# Patient Record
Sex: Female | Born: 1985 | Race: White | Hispanic: No | Smoking: Former smoker
Health system: Southern US, Community
[De-identification: ages and names within clinical notes are randomized; demographics above are authoritative.]

## PROBLEM LIST (undated history)

## (undated) DIAGNOSIS — N644 Mastodynia: Secondary | ICD-10-CM

## (undated) DIAGNOSIS — E039 Hypothyroidism, unspecified: Secondary | ICD-10-CM

## (undated) DIAGNOSIS — D649 Anemia, unspecified: Secondary | ICD-10-CM

## (undated) DIAGNOSIS — Z720 Tobacco use: Secondary | ICD-10-CM

## (undated) DIAGNOSIS — F32A Depression, unspecified: Secondary | ICD-10-CM

## (undated) HISTORY — DX: Anemia, unspecified: D64.9

## (undated) HISTORY — DX: Tobacco use: Z72.0

## (undated) HISTORY — DX: Depression, unspecified: F32.A

## (undated) HISTORY — DX: Hypothyroidism, unspecified: E03.9

## (undated) HISTORY — PX: OTHER SURGICAL HISTORY: SHX169

## (undated) HISTORY — DX: Mastodynia: N64.4

---
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
## 2007-08-21 ENCOUNTER — Emergency Department: Payer: Self-pay | Admitting: Emergency Medicine

## 2009-02-10 HISTORY — PX: INTRAUTERINE DEVICE (IUD) INSERTION: SHX5877

## 2009-05-09 ENCOUNTER — Observation Stay: Payer: Self-pay

## 2009-06-24 ENCOUNTER — Observation Stay: Payer: Self-pay

## 2009-07-09 ENCOUNTER — Observation Stay: Payer: Self-pay | Admitting: Obstetrics and Gynecology

## 2009-08-05 ENCOUNTER — Observation Stay: Payer: Self-pay

## 2009-08-29 ENCOUNTER — Observation Stay: Payer: Self-pay | Admitting: Obstetrics and Gynecology

## 2009-09-07 ENCOUNTER — Inpatient Hospital Stay: Payer: Self-pay

## 2010-09-24 ENCOUNTER — Emergency Department: Payer: Self-pay | Admitting: Emergency Medicine

## 2010-11-11 HISTORY — PX: IUD REMOVAL: SHX5392

## 2014-12-01 ENCOUNTER — Emergency Department
Admission: EM | Admit: 2014-12-01 | Discharge: 2014-12-02 | Disposition: A | Discharge status: Home / Self Care | Payer: BLUE CROSS/BLUE SHIELD | Attending: Emergency Medicine | Admitting: Emergency Medicine

## 2014-12-01 ENCOUNTER — Encounter: Payer: Self-pay | Admitting: Emergency Medicine

## 2014-12-01 DIAGNOSIS — M549 Dorsalgia, unspecified: Secondary | ICD-10-CM | POA: Diagnosis not present

## 2014-12-01 DIAGNOSIS — R1030 Lower abdominal pain, unspecified: Secondary | ICD-10-CM | POA: Diagnosis present

## 2014-12-01 DIAGNOSIS — Z720 Tobacco use: Secondary | ICD-10-CM | POA: Diagnosis not present

## 2014-12-01 DIAGNOSIS — Z3202 Encounter for pregnancy test, result negative: Secondary | ICD-10-CM | POA: Insufficient documentation

## 2014-12-01 LAB — COMPREHENSIVE METABOLIC PANEL
ALT: 8 U/L — ABNORMAL LOW (ref 14–54)
AST: 14 U/L — ABNORMAL LOW (ref 15–41)
Albumin: 3.8 g/dL (ref 3.5–5.0)
Alkaline Phosphatase: 27 U/L — ABNORMAL LOW (ref 38–126)
Anion gap: 3 — ABNORMAL LOW (ref 5–15)
BUN: 7 mg/dL (ref 6–20)
CHLORIDE: 109 mmol/L (ref 101–111)
CO2: 25 mmol/L (ref 22–32)
Calcium: 9.1 mg/dL (ref 8.9–10.3)
Creatinine, Ser: 0.64 mg/dL (ref 0.44–1.00)
GFR calc Af Amer: >60 mL/min (ref >60)
GFR calc non Af Amer: >60 mL/min (ref >60)
GLUCOSE: 91 mg/dL (ref 65–99)
POTASSIUM: 3.9 mmol/L (ref 3.5–5.1)
Sodium: 137 mmol/L (ref 135–145)
Total Bilirubin: 0.3 mg/dL (ref 0.3–1.2)
Total Protein: 6.7 g/dL (ref 6.5–8.1)

## 2014-12-01 LAB — URINALYSIS COMPLETE WITH MICROSCOPIC (ARMC ONLY)
Bilirubin Urine: NEGATIVE
Glucose, UA: NEGATIVE mg/dL
Hgb urine dipstick: NEGATIVE
Ketones, ur: NEGATIVE mg/dL
Leukocytes, UA: NEGATIVE
Nitrite: NEGATIVE
PH: 5 (ref 5.0–8.0)
PROTEIN: NEGATIVE mg/dL
Specific Gravity, Urine: 1.01 (ref 1.005–1.030)

## 2014-12-01 LAB — POCT PREGNANCY, URINE: Preg Test, Ur: NEGATIVE

## 2014-12-01 NOTE — ED Notes (Signed)
Pt. States bilateral lower abdominal pain and back pain that started Wednesday morning. Pt. Denies vomiting. Pt. States "I went to [facility] Wednesday, pt. States pain medication with rx has not decreased pain. Pt. States hx of ov ian cysts. Pt. Denies vaginal discharge.  Pt. Denies difficulty eating or drinking today.  Pt denies dysuria.

## 2014-12-01 NOTE — ED Notes (Signed)
Pt. States bilateral lower abdominal pain and back pain that started Wednesday morning.  Pt. Denies vomiting.  Pt. States "I went to [place] [facility] Wednesday, pt. States pain medication with rx has not decreased pain.  Pt. States hx of ov ian cysts.  Pt. Denies vaginal discharge.

## 2014-12-02 NOTE — ED Notes (Signed)
Pt. Going home with significant other.  Pt. States she will try to find PCP with suggestions given.

## 2014-12-02 NOTE — Discharge Instructions (Signed)
You have been seen in the Emergency Department (ED) for abdominal pain.  Your evaluation did not identify a clear cause of your symptoms but was generally reassuring.  As we discussed, we suspect her symptoms are due to ovarian cysts which are likely worse some months than they are others.  We recommend that you take over-the-counter pain medication as needed, read through the included information for more recommendations, and follow up with a primary care doctor and/or OB/GYN at the next available opportunity.  Please follow up as instructed above regarding todays emergent visit and the symptoms that are bothering you.  Return to the ED if your abdominal pain worsens or fails to improve, you develop bloody vomiting, bloody diarrhea, you are unable to tolerate fluids due to vomiting, fever greater than 101, or other symptoms that concern you.   Abdominal Pain, Adult Many things can cause abdominal pain. Usually, abdominal pain is not caused by a disease and will improve without treatment. It can often be observed and treated at home. Your health care provider will do a physical exam and possibly order blood tests and X-rays to help determine the seriousness of your pain. However, in many cases, more time must pass before a clear cause of the pain can be found. Before that point, your health care provider may not know if you need more testing or further treatment. HOME CARE INSTRUCTIONS Monitor your abdominal pain for any changes. The following actions may help to alleviate any discomfort you are experiencing:  Only take over-the-counter or prescription medicines as directed by your health care provider.  Do not take laxatives unless directed to do so by your health care provider.  Try a clear liquid diet (broth, tea, or water) as directed by your health care provider. Slowly move to a bland diet as tolerated. SEEK MEDICAL CARE IF:  You have unexplained abdominal pain.  You have abdominal pain  associated with nausea or diarrhea.  You have pain when you urinate or have a bowel movement.  You experience abdominal pain that wakes you in the night.  You have abdominal pain that is worsened or improved by eating food.  You have abdominal pain that is worsened with eating fatty foods.  You have a fever. SEEK IMMEDIATE MEDICAL CARE IF:  Your pain does not go away within 2 hours.  You keep throwing up (vomiting).  Your pain is felt only in portions of the abdomen, such as the right side or the left lower portion of the abdomen.  You pass bloody or black tarry stools. MAKE SURE YOU:  Understand these instructions.  Will watch your condition.  Will get help right away if you are not doing well or get worse.   This information is not intended to replace advice given to you by your health care provider. Make sure you discuss any questions you have with your health care provider.   Document Released: 11/06/2004 Document Revised: 10/18/2014 Document Reviewed: 10/06/2012 Elsevier Interactive Patient Education [employer]2016 Elsevier Inc.

## 2014-12-02 NOTE — ED Provider Notes (Signed)
[facility] Emergency Department Provider Note  ____________________________________________  Time seen: Approximately 12:36 AM  I have reviewed the triage vital signs and the nursing notes.   HISTORY  Chief Complaint Abdominal Pain    HPI Karen Carey is a 29 y.o. female with no significant past medical history except for intermittent issues with pain thought to be associated with ovarian cysts.  She presents with persistent bilateral lower abdominal pain and back pain which she says started 4 days ago.  She has had no nausea or vomiting.  She says that she went to the [place] emergency department about 2 days ago and "they did not do anything or find anything".  She was prescribed Norco 10 pills that states that these did not help her pain.  The pain has been gradual in onset, intermittent, severe, worsened with movement, and helped by nothing.  She denies dysuria, vaginal discharge, vaginal pain.  Her last menstrual period was about 3 weeks ago.  She has had this problem in the past, most recently in August, and said that she feels similar to the pain at that time.  She was intended to follow up with an OB/GYN and her primary care doctor, but she has not established care with either type of physician since her issues with ovarian pain in August.   History reviewed. No pertinent past medical history.  There are no active problems to display for this patient.   History reviewed. No pertinent past surgical history.  No current outpatient prescriptions on file.  Allergies Review of patient's allergies indicates no known allergies.  No family history on file.  Social History Social History  Substance Use Topics  . Smoking status: Current Every Day Smoker  . Smokeless tobacco: None  . Alcohol Use: Yes    Review of Systems Constitutional: No fever/chills Eyes: No visual changes. ENT: No sore throat. Cardiovascular: Denies chest  pain. Respiratory: Denies shortness of breath. Gastrointestinal: Lower abdominal pain.  No nausea, no vomiting.  No diarrhea.  No constipation. Genitourinary: Negative for dysuria. Musculoskeletal: Negative for back pain. Skin: Negative for rash. Neurological: Negative for headaches, focal weakness or numbness.  10-point ROS otherwise negative.  ____________________________________________   PHYSICAL EXAM:  VITAL SIGNS: ED Triage Vitals  Enc Vitals Group     BP 12/01/14 2104 123/77 mmHg     Pulse Rate 12/01/14 2104 83     Resp 12/01/14 2104 18     Temp 12/01/14 2104 97.8 F (36.6 C)     Temp src --      SpO2 12/01/14 2104 100 %     Weight 12/01/14 2104 120 lb (54.432 kg)     Height 12/01/14 2104  (1.575 m)     Head Cir --      Peak Flow --      Pain Score 12/01/14 2116 9     Pain Loc --      Pain Edu? --      Excl. in GC? --     Constitutional: Alert and oriented. Well appearing and in no acute distress. VSS, afebrile. Eyes: Conjunctivae are normal. PERRL. EOMI. Head: Atraumatic. Nose: No congestion/rhinnorhea. Mouth/Throat: Mucous membranes are moist.  Oropharynx non-erythematous. Neck: No stridor.   Cardiovascular: Normal rate, regular rhythm. Grossly normal heart sounds.  Good peripheral circulation. Respiratory: Normal respiratory effort.  No retractions. Lungs CTAB. Gastrointestinal: Soft with voluntary guarding to palpation of lower abdomen.  With distraction, she is minimally tender. No distention. No  CVA tenderness. Genitourinary: Deferred Musculoskeletal: No lower extremity tenderness nor edema.  No joint effusions. Neurologic:  Normal speech and language. No gross focal neurologic deficits are appreciated.  Skin:  Skin is warm, dry and intact. No rash noted. Psychiatric: Mood and affect are normal. Speech and behavior are normal.  ____________________________________________   LABS (all labs ordered are listed, but only abnormal results are  displayed)  Labs Reviewed  COMPREHENSIVE METABOLIC PANEL - Abnormal; Notable for the following:    AST 14 (*)    ALT 8 (*)    Alkaline Phosphatase 27 (*)    Anion gap 3 (*)    All other components within normal limits  URINALYSIS COMPLETEWITH MICROSCOPIC ([facility] ONLY) - Abnormal; Notable for the following:    Color, Urine YELLOW (*)    APPearance HAZY (*)    Bacteria, UA RARE (*)    Squamous Epithelial / LPF 6-30 (*)    All other components within normal limits  POCT PREGNANCY, URINE   ____________________________________________  EKG  Not indicated ____________________________________________  RADIOLOGY   No results found.  ____________________________________________   PROCEDURES  Procedure(s) performed: None  Critical Care performed: No ____________________________________________   INITIAL IMPRESSION / ASSESSMENT AND PLAN / ED COURSE  Pertinent labs & imaging results that were available during my care of the patient were reviewed by me and considered in my medical decision making (see chart for details).  The patient is well-appearing with normal vital signs.  I reviewed the entire emergency Department provider note and care everywhere from [place] visit [facility] and she received a full and thorough workup including a pelvic exam, GC/chlamydia swabs, wet prep, and lab work.  They did not obtain any imaging at that time because they did not feel it was indicated, and I agree.  I offered that we could obtain a transvaginal ultrasound at this time if she wants, but explained that I suspect her pain is due to ovarian cysts again, she declined having the imaging.  I explained that I cannot continue to write her for narcotic pain medicine and that NSAIDs are the best treatment for her.  I explained that she needs to follow up with primary care and I gave her 2 options for local doctors in [place] with she can follow-up if she does not want to follow-up in [place].   She understands and agrees with the plan.  ____________________________________________  FINAL CLINICAL IMPRESSION(S) / ED DIAGNOSES  Final diagnoses:  Lower abdominal pain      NEW MEDICATIONS STARTED DURING THIS VISIT:  New Prescriptions   No medications on file     Loleta Roseory Kyrsten Deleeuw, MD 12/02/14 [phone]

## 2017-06-11 ENCOUNTER — Encounter: Payer: Self-pay | Admitting: Certified Nurse Midwife

## 2017-06-12 ENCOUNTER — Encounter: Payer: Self-pay | Admitting: Certified Nurse Midwife

## 2017-06-12 ENCOUNTER — Ambulatory Visit (INDEPENDENT_AMBULATORY_CARE_PROVIDER_SITE_OTHER): Payer: BLUE CROSS/BLUE SHIELD | Admitting: Certified Nurse Midwife

## 2017-06-12 VITALS — BP 126/62 | HR 101 | Ht 62.0 in | Wt 157.0 lb

## 2017-06-12 DIAGNOSIS — Z124 Encounter for screening for malignant neoplasm of cervix: Secondary | ICD-10-CM | POA: Diagnosis not present

## 2017-06-12 DIAGNOSIS — Z01419 Encounter for gynecological examination (general) (routine) without abnormal findings: Secondary | ICD-10-CM

## 2017-06-12 DIAGNOSIS — Z13220 Encounter for screening for lipoid disorders: Secondary | ICD-10-CM | POA: Diagnosis not present

## 2017-06-12 DIAGNOSIS — Z131 Encounter for screening for diabetes mellitus: Secondary | ICD-10-CM

## 2017-06-12 DIAGNOSIS — F17210 Nicotine dependence, cigarettes, uncomplicated: Secondary | ICD-10-CM | POA: Diagnosis not present

## 2017-06-12 NOTE — Patient Instructions (Signed)
Vit E 400 IU twice a day  Steps to Quit Smoking Smoking tobacco can be harmful to your health and can affect almost every organ in your body. Smoking puts you, and those around you, at risk for developing many serious chronic diseases. Quitting smoking is difficult, but it is one of the best things that you can do for your health. It is never too late to quit. What are the benefits of quitting smoking? When you quit smoking, you lower your risk of developing serious diseases and conditions, such as:  Lung cancer or lung disease, such as COPD.  Heart disease.  Stroke.  Heart attack.  Infertility.  Osteoporosis and bone fractures.  Additionally, symptoms such as coughing, wheezing, and shortness of breath may get better when you quit. You may also find that you get sick less often because your body is stronger at fighting off colds and infections. If you are pregnant, quitting smoking can help to reduce your chances of having a baby of low birth weight. How do I get ready to quit? When you decide to quit smoking, create a plan to make sure that you are successful. Before you quit:  Pick a date to quit. Set a date within the next two weeks to give you time to prepare.  Write down the reasons why you are quitting. Keep this list in places where you will see it often, such as on your bathroom mirror or in your car or wallet.  Identify the people, places, things, and activities that make you want to smoke (triggers) and avoid them. Make sure to take these actions: ? Throw away all cigarettes at home, at work, and in your car. ? Throw away smoking accessories, such as Set designer. ? Clean your car and make sure to empty the ashtray. ? Clean your home, including curtains and carpets.  Tell your family, friends, and coworkers that you are quitting. Support from your loved ones can make quitting easier.  Talk with your health care provider about your options for quitting  smoking.  Find out what treatment options are covered by your health insurance.  What strategies can I use to quit smoking? Talk with your healthcare provider about different strategies to quit smoking. Some strategies include:  Quitting smoking altogether instead of gradually lessening how much you smoke over a period of time. Research shows that quitting "cold [place]" is more successful than gradually quitting.  Attending in-person counseling to help you build problem-solving skills. You are more likely to have success in quitting if you attend several counseling sessions. Even short sessions of 10 minutes can be effective.  Finding resources and support systems that can help you to quit smoking and remain smoke-free after you quit. These resources are most helpful when you use them often. They can include: ? Online chats with a Veterinary surgeon. ? Telephone quitlines. ? Automotive engineer. ? Support groups or group counseling. ? Text messaging programs. ? Mobile phone applications.  Taking medicines to help you quit smoking. (If you are pregnant or breastfeeding, talk with your health care provider first.) Some medicines contain nicotine and some do not. Both types of medicines help with cravings, but the medicines that include nicotine help to relieve withdrawal symptoms. Your health care provider may recommend: ? Nicotine patches, gum, or lozenges. ? Nicotine inhalers or sprays. ? Non-nicotine medicine that is taken by mouth.  Talk with your health care provider about combining strategies, such as taking medicines while you are also  receiving in-person counseling. Using these two strategies together makes you more likely to succeed in quitting than if you used either strategy on its own. If you are pregnant or breastfeeding, talk with your health care provider about finding counseling or other support strategies to quit smoking. Do not take medicine to help you quit smoking unless  told to do so by your health care provider. What things can I do to make it easier to quit? Quitting smoking might feel overwhelming at first, but there is a lot that you can do to make it easier. Take these important actions:  Reach out to your family and friends and ask that they support and encourage you during this time. Call telephone quitlines, reach out to support groups, or work with a counselor for support.  Ask people who smoke to avoid smoking around you.  Avoid places that trigger you to smoke, such as bars, parties, or smoke-break areas at work.  Spend time around people who do not smoke.  Lessen stress in your life, because stress can be a smoking trigger for some people. To lessen stress, try: ? Exercising regularly. ? Deep-breathing exercises. ? Yoga. ? Meditating. ? Performing a body scan. This involves closing your eyes, scanning your body from head to toe, and noticing which parts of your body are particularly tense. Purposefully relax the muscles in those areas.  Download or purchase mobile phone or tablet apps (applications) that can help you stick to your quit plan by providing reminders, tips, and encouragement. There are many free apps, such as QuitGuide from the [employer] developer for Disease Control and Prevention). You can find other support for quitting smoking (smoking cessation) through smokefree.gov and other websites.  How will I feel when I quit smoking? Within the first 24 hours of quitting smoking, you may start to feel some withdrawal symptoms. These symptoms are usually most noticeable 2-3 days after quitting, but they usually do not last beyond 2-3 weeks. Changes or symptoms that you might experience include:  Mood swings.  Restlessness, anxiety, or irritation.  Difficulty concentrating.  Dizziness.  Strong cravings for sugary foods in addition to nicotine.  Mild weight gain.  Constipation.  Nausea.  Coughing or a sore throat.  Changes in how  your medicines work in your body.  A depressed mood.  Difficulty sleeping (insomnia).  After the first 2-3 weeks of quitting, you may start to notice more positive results, such as:  Improved sense of smell and taste.  Decreased coughing and sore throat.  Slower heart rate.  Lower blood pressure.  Clearer skin.  The ability to breathe more easily.  Fewer sick days.  Quitting smoking is very challenging for most people. Do not get discouraged if you are not successful the first time. Some people need to make many attempts to quit before they achieve long-term success. Do your best to stick to your quit plan, and talk with your health care provider if you have any questions or concerns. This information is not intended to replace advice given to you by your health care provider. Make sure you discuss any questions you have with your health care provider. Document Released: 01/21/2001 Document Revised: 09/25/2015 Document Reviewed: 06/13/2014 Elsevier Interactive Patient Education  [employer].

## 2017-06-12 NOTE — Progress Notes (Signed)
Gynecology Annual Exam  PCP: Patient, No Pcp Per  Chief Complaint:  Chief Complaint  Patient presents with  . Gynecologic Exam    Fibrocystic breast    History of Present Illness: Karen Carey is a 33 y.o. White female, G1P1001, who presents for a NP gyn exam. She has not been seen at [facility] The patient complains of breast tenderness all but 1 week of the month since she was 70 or 32 years old. She has tried stopping caffeine, but reports she still had breast tenderness when she stopped drinking 1-2 20oz bottles of Dr Reino Kent and 2 glasses of sweet tea/day, so she resumed drinking caffeine. She has also taken Tylenol and ibuprofen and applied ice and heat with no relief. Her breasts feel lumpy when she does SBEs but she has never had a dominant cyst or mass.  Her menses are regular, they occur every month, and they last 7days with 4-5 heavier days requiring a tampon change every 2 hours. She does not have intermenstrual bleeding. Her last menstrual period was 05/28/2017. She has mild dysmenorrhea and bloating and takes Midol or uses a heating pad with relief Last pap smear: 3 years ago, results were normal per patient   The patient is sexually active. She currently uses Nexplanon (replaced 09/26/2014) for contraception. She has her current partner x 6 months. She declines STD testing   Her past medical history is remarkable for a TSVD in 2011.  The patient does perform self breast exams. Her last mammogram was NA.  There is no family history of breast cancer.   There is no family history of ovarian cancer.   The patient reports smoking. She smokes 0.5 packs per day. Started smoking at age 6, but stopped smoking during her pregnancy with the help of the nicotine patch, but then resumed smoking.  She reports drinking alcohol. She reports have 0-3 shots per night.   She denies illegal drug use.  The patient does not exercise.  The patient denies current symptoms of  depression.    Review of Systems: Review of Systems  Constitutional: Negative for chills, fever and weight loss.  HENT: Negative for congestion, sinus pain and sore throat.   Eyes: Negative for blurred vision and pain.  Respiratory: Negative for hemoptysis, shortness of breath and wheezing.   Cardiovascular: Negative for chest pain, palpitations and leg swelling.  Gastrointestinal: Negative for abdominal pain, blood in stool, diarrhea, heartburn, nausea and vomiting.  Genitourinary: Negative for dysuria, frequency, hematuria and urgency.       Positive for subjectively heavy menses  Musculoskeletal: Negative for back pain, joint pain and myalgias.  Skin: Negative for itching and rash.  Neurological: Negative for dizziness, tingling and headaches.  Endo/Heme/Allergies: Negative for environmental allergies and polydipsia. Does not bruise/bleed easily.       Negative for hirsutism   Psychiatric/Behavioral: Negative for depression. The patient is not nervous/anxious and does not have insomnia.   Breast: tenderness bilaterally, lumpy. Negative for nipple discharge  Past Medical History:  No past medical history on file.  Past Surgical History:  Past Surgical History:  Procedure Laterality Date  . INTRAUTERINE DEVICE (IUD) INSERTION  2011  . IUD REMOVAL  11/2010  . Nexplanon  11/25/2010   [facility]    Family History:  Family History  Problem Relation Age of Onset  . Diabetes Mother        Type 2  . Hypertension Mother   . Hypothyroidism Mother  Social History:  Social History   Socioeconomic History  . Marital status: Single    Spouse name: Not on file  . Number of children: Not on file  . Years of education: Not on file  . Highest education level: Not on file  Occupational History  . Not on file  Social Needs  . Financial resource strain: Not on file  . Food insecurity:    Worry: Not on file    Inability: Not on file  . Transportation needs:    Medical: Not on  file    Non-medical: Not on file  Tobacco Use  . Smoking status: Current Every Day Smoker  . Smokeless tobacco: Never Used  Substance and Sexual Activity  . Alcohol use: Yes  . Drug use: No  . Sexual activity: Yes    Partners: Male    Birth control/protection: Implant  Lifestyle  . Physical activity:    Days per week: 0 days    Minutes per session: 0 min  . Stress: Not on file  Relationships  . Social connections:    Talks on phone: Not on file    Gets together: Not on file    Attends religious service: Not on file    Active member of club or organization: Not on file    Attends meetings of clubs or organizations: Not on file    Relationship status: Not on file  . Intimate partner violence:    Fear of current or ex partner: Not on file    Emotionally abused: Not on file    Physically abused: Not on file    Forced sexual activity: Not on file  Other Topics Concern  . Not on file  Social History Narrative  . Not on file    Allergies:  No Known Allergies  Medications: Prior to Admission medications   Medication Sig Start Date End Date Taking? Authorizing Provider  dicyclomine (BENTYL) 20 MG tablet Take by mouth. 06/06/17 06/16/17 Yes [provider]  etonogestrel (NEXPLANON) 68 MG IMPL implant 1 each by Subdermal route once.   Yes [provider]  ibuprofen (ADVIL,MOTRIN) 800 MG tablet Take by mouth. 06/06/17  Yes [provider]    Physical Exam Vitals: BP 126/62 (BP Location: Left Arm, Patient Position: Sitting, Cuff Size: Normal)   Pulse (!) 101   Ht  (1.575 m)   Wt 157 lb (71.2 kg)   LMP 05/28/2017   BMI 28.72 kg/m General:WF in  NAD HEENT: normocephalic, anicteric Neck: no thyroid enlargement, no palpable nodules, no cervical lymphadenopathy  Pulmonary: No increased work of breathing, CTAB Cardiovascular: RRR, without murmur  Breast: Breast symmetrical, tenderness bilaterally , no palpable nodules or masses, no skin or nipple  retraction present, no nipple discharge.  No axillary, infraclavicular or supraclavicular lymphadenopathy. Abdomen: Soft, non-tender, non-distended.  Umbilicus without lesions.  No hepatomegaly or masses palpable. No evidence of hernia. Genitourinary:  External: Normal external female genitalia.  Normal urethral meatus, normal Bartholin's and  Skene's glands.    Vagina: Normal vaginal mucosa, no evidence of prolapse.    Cervix: Grossly normal in appearance, no bleeding, non-tender  Uterus: Anteverted, normal size, shape, and consistency, mobile, and non-tender  Adnexa: No adnexal masses, non-tender  Rectal: deferred  Lymphatic: no evidence of inguinal lymphadenopathy Extremities: no edema, erythema, or tenderness Neurologic: Grossly intact Psychiatric: mood appropriate, affect full     Assessment: 32 y.o. G1P1001 normal gyn exam Tobacco use Contraceptive counseling Mastalgia  Plan:    1)  Breast cancer screening - recommend monthly self breast exam. Discussed how to examine breasts. Suggested trying vitamin E 400 IU BID for mastalgia. Give it at least 6 weeks  2) STI screening was offered and declined.  3) Cervical cancer screening - Pap was done. ASCCP guidelines and rational discussed.  Patient opts for every 3 years screening interval  4) Contraception - Nexplanon expires in August. She was asking about other forms of contraception that might decrease her flow. Discussed use of continuous OCPS or Nuvaring. Tried Mirena IUD in the past, but had it removed after bleeding all the time. Will consider options and return later this year for Nexplanon removal  5) Routine healthcare maintenance including cholesterol and diabetes screening ordered today   6) Discussed smoking cessation, but she is not ready to stop at this time. Recommend multivitamin with calcium and vitamin D, as she gets little calcium and vitamin D3 in her diet and she gets few fruits and vegetables.  Farrel Conners, CNM

## 2017-06-13 LAB — LIPID PANEL WITH LDL/HDL RATIO
CHOLESTEROL TOTAL: 157 mg/dL (ref 100–199)
HDL: 32 mg/dL — AB (ref >39)
LDL Calculated: 99 mg/dL (ref 0–99)
LDL/HDL RATIO: 3.1 ratio (ref 0.0–3.2)
TRIGLYCERIDES: 130 mg/dL (ref 0–149)
VLDL Cholesterol Cal: 26 mg/dL (ref 5–40)

## 2017-06-13 LAB — HGB A1C W/O EAG: Hgb A1c MFr Bld: 4.9 % (ref 4.8–5.6)

## 2017-06-15 ENCOUNTER — Encounter (INDEPENDENT_AMBULATORY_CARE_PROVIDER_SITE_OTHER): Payer: Self-pay

## 2017-06-16 LAB — IGP,RFX APTIMA HPV ALL PTH: PAP Smear Comment: 0

## 2017-08-06 ENCOUNTER — Telehealth: Payer: Self-pay | Admitting: Certified Nurse Midwife

## 2017-08-06 NOTE — Telephone Encounter (Signed)
Patient is schedule for Nexplanon removal and reinsertion on 09/11/17 with CLG at 10 am

## 2017-08-26 NOTE — Telephone Encounter (Signed)
Noted. Will order to arrive by apt date/time. 

## 2017-09-04 NOTE — Telephone Encounter (Signed)
Patient is reschedule to 09/18/17 at 8:50

## 2017-09-11 ENCOUNTER — Ambulatory Visit: Payer: BLUE CROSS/BLUE SHIELD | Admitting: Certified Nurse Midwife

## 2017-09-18 ENCOUNTER — Encounter: Payer: Self-pay | Admitting: Certified Nurse Midwife

## 2017-09-18 ENCOUNTER — Other Ambulatory Visit: Payer: Self-pay

## 2017-09-18 ENCOUNTER — Ambulatory Visit (INDEPENDENT_AMBULATORY_CARE_PROVIDER_SITE_OTHER): Payer: BLUE CROSS/BLUE SHIELD | Admitting: Certified Nurse Midwife

## 2017-09-18 VITALS — BP 104/60 | HR 97 | Ht 62.0 in | Wt 153.0 lb

## 2017-09-18 DIAGNOSIS — Z3046 Encounter for surveillance of implantable subdermal contraceptive: Secondary | ICD-10-CM

## 2017-09-18 DIAGNOSIS — Z3049 Encounter for surveillance of other contraceptives: Secondary | ICD-10-CM

## 2017-09-18 DIAGNOSIS — Z30017 Encounter for initial prescription of implantable subdermal contraceptive: Secondary | ICD-10-CM

## 2017-09-18 NOTE — Progress Notes (Signed)
  Nexplanon removal: Karen Carey is a 32 y.o. White female, G1P1001, who presents for a Nexplanon replacement. Her current Nexplanon  Is her second implant and was [facility]. She has monthly cycles, lasting about 7 days and moderately heavy x 4-5 days requiring tampon change every 2 hours. She had considered other methods of birth control, but decided to get her Nexplanon replaced.   Patient given informed consent, signed copy in the chart, time out was performed. Procedure note - The Nexplanon was noted in the patient's left arm and the end was identified. The skin was cleansed with a Betadine solution. A small injection of subcutaneous lidocaine 1% was given under the distal end of the implant. An incision was made at the end of the implant. The rod was noted in the incision and grasped with a hemostat. It was noted to be intact.  The rest of the 2cc of Lidocaine 1% was then injected through the incision and along the tract where the new Nexplanon was to be inserted. The new Nexplanon was removed from the package and the device was confirmed in the needle. The inserter needle was inserted the full length of the needle and withdrawn per handbook instructions. Steri-Strip was placed approximating the incision. Hemostasis was noted. A Bandaid was placed over the steristrip and then the Curlex pressure dressing was wrapped around the arm. Minimal blood loss and patient tolerated the procedure well.   Patient was advised to remove the Curlex tomorrow and change the Bandaid daily x 3-5 days.  Follow up for her annual next May and prn.  [name] ,MD 09/18/2017,8:55 AM

## 2018-03-22 ENCOUNTER — Other Ambulatory Visit: Payer: Self-pay

## 2018-03-22 ENCOUNTER — Encounter: Payer: Self-pay | Admitting: Certified Nurse Midwife

## 2018-03-22 ENCOUNTER — Ambulatory Visit (INDEPENDENT_AMBULATORY_CARE_PROVIDER_SITE_OTHER): Payer: BLUE CROSS/BLUE SHIELD | Admitting: Certified Nurse Midwife

## 2018-03-22 VITALS — BP 110/72 | HR 90 | Ht 62.0 in | Wt 161.0 lb

## 2018-03-22 DIAGNOSIS — N644 Mastodynia: Secondary | ICD-10-CM | POA: Diagnosis not present

## 2018-03-22 DIAGNOSIS — N6310 Unspecified lump in the right breast, unspecified quadrant: Secondary | ICD-10-CM

## 2018-03-22 DIAGNOSIS — N6314 Unspecified lump in the right breast, lower inner quadrant: Secondary | ICD-10-CM

## 2018-03-22 DIAGNOSIS — Z720 Tobacco use: Secondary | ICD-10-CM | POA: Insufficient documentation

## 2018-03-22 NOTE — Progress Notes (Signed)
Obstetrics & Gynecology Office Visit   Chief Complaint:  Chief Complaint  Patient presents with  . Breast Problem    small lump right breast felt ~1 month ago    History of Present Illness: 33 year old G1 P1001 with a history of fibrocystic breast changes and mastalgia presents with complaints of a right breast lump in her inner lower quadrant.. She first felt this breast lump 1 mos ago and it did not resolve with her most recent LMP 03/11/2018. She currently uses the Nexplanon for contraception. Has a history of mastalgia and has pain all the time except for the week of her menses and a couple days before and after her menses. She has tried numerous bras, but they all are uncomfortable. She has trouble sleeping at night due to the painful breasts. Has been taking vitamin E 400 IU BID without relief of her pain. She does drink a lot of caffienated tea, but has decreased her Dr Reino Kent intake. Has never had any breast surgery. No family history of breast cancer.    Review of Systems:  ROS -comprehensive review of sysstems negative except for fatigue and her breast concerns in the HPI.  Past Medical History:  Past Medical History:  Diagnosis Date  . Mastalgia   . Tobacco use     Past Surgical History:  Past Surgical History:  Procedure Laterality Date  . INTRAUTERINE DEVICE (IUD) INSERTION  2011  . IUD REMOVAL  11/2010  . Nexplanon  11/25/2010/ 09/26/2014   [facility]    Gynecologic History: Patient's last menstrual period was 03/11/2018 (approximate).  Obstetric History: G1P1001  Family History:  Family History  Problem Relation Age of Onset  . Diabetes Mother        Type 2  . Hypertension Mother   . Hypothyroidism Mother   . COPD Mother   . Other Father        does not know biological father    Social History:  Social History   Socioeconomic History  . Marital status: Divorced    Spouse name: Not on file  . Number of children: 1  . Years of education: Not on file   . Highest education level: Not on file  Occupational History  . Occupation: International aid/development worker  Social Needs  . Financial resource strain: Not on file  . Food insecurity:    Worry: Not on file    Inability: Not on file  . Transportation needs:    Medical: Not on file    Non-medical: Not on file  Tobacco Use  . Smoking status: Current Every Day Smoker    Packs/day: 0.50    Years: 12.00    Pack years: 6.00  . Smokeless tobacco: Never Used  . Tobacco comment: stopped smoking with pregnancy, but restarted  Substance and Sexual Activity  . Alcohol use: Yes    Comment: 0-3 shots of liquor/night  . Drug use: No  . Sexual activity: Yes    Partners: Male    Birth control/protection: Implant  Lifestyle  . Physical activity:    Days per week: 0 days    Minutes per session: 0 min  . Stress: Not on file  Relationships  . Social connections:    Talks on phone: Not on file    Gets together: Not on file    Attends religious service: Not on file    Active member of club or organization: Not on file    Attends meetings of clubs or organizations:  Not on file    Relationship status: Not on file  . Intimate partner violence:    Fear of current or ex partner: Not on file    Emotionally abused: Not on file    Physically abused: Not on file    Forced sexual activity: Not on file  Other Topics Concern  . Not on file  Social History Narrative  . Not on file    Allergies:  No Known Allergies  Medications: Prior to Admission medications   Medication Sig Start Date End Date Taking? Authorizing Provider  etonogestrel (NEXPLANON) 68 MG IMPL implant 1 each by Subdermal route once.   Yes [provider]  ibuprofen (ADVIL,MOTRIN) 800 MG tablet Take by mouth. 06/06/17  Yes [provider]  Multiple Vitamin (MULTIVITAMIN) tablet Take 1 tablet by mouth daily.   Yes [provider]  Multiple Vitamins-Iron (MULTIVITAMINS WITH IRON) TABS tablet Take 1 tablet by mouth  daily. 65mg    Yes [provider]  vitamin C (ASCORBIC ACID) 500 MG tablet Take 1,000 mg by mouth daily.    Yes [provider]  dicyclomine (BENTYL) 20 MG tablet Take by mouth. 06/06/17 06/16/17  [provider]  vitamin E 100 UNIT capsule Take 400 Units by mouth 2 (two) times daily.     [provider]    Physical Exam Vitals:BP 110/72 (BP Location: Right Arm, Patient Position: Sitting, Cuff Size: Normal)   Pulse 90   Ht 5\' 2"  (1.575 m)   Wt 161 lb (73 kg)   LMP 03/11/2018 (Approximate)   BMI 29.45 kg/m  Patient's last menstrual period was 03/11/2018 (approximate).  Physical Exam  Constitutional: She is oriented to person, place, and time. She appears well-developed and well-nourished. No distress.  HENT:  Head: Normocephalic and atraumatic.  Cardiovascular: Normal rate.  Respiratory: Effort normal.    Breasts: Right breast: 1cm oval mass at 4 o'clock, 3 cm from areola, slightly tender. No nipple or skin changes. No nipple discharge. Left breast: no masses, no nipple or skin changes. No nipple discharge. No supracervical, infraclavicular, or axillary lymphadenopathy bilaterally.  Neurological: She is alert and oriented to person, place, and time.  Skin: Skin is warm and dry.  Psychiatric: She has a normal mood and affect.     Assessment: 33 y.o. G1P1001 with history of fibrocystic changes and right breast mass Mastalgia  Plan: Diagnostic mammogram with right breast ultrasound Referral to Dr Lemar Livings after the mammogram and ultrasound for breast mass and breast pain.  Farrel Conners, CNM

## 2018-03-23 ENCOUNTER — Other Ambulatory Visit: Payer: Self-pay

## 2018-03-23 ENCOUNTER — Ambulatory Visit (INDEPENDENT_AMBULATORY_CARE_PROVIDER_SITE_OTHER): Payer: BLUE CROSS/BLUE SHIELD | Admitting: Surgery

## 2018-03-23 ENCOUNTER — Encounter: Payer: Self-pay | Admitting: Surgery

## 2018-03-23 VITALS — BP 127/85 | HR 90 | Temp 97.7°F | Ht 63.0 in | Wt 163.0 lb

## 2018-03-23 DIAGNOSIS — N644 Mastodynia: Secondary | ICD-10-CM | POA: Diagnosis not present

## 2018-03-23 NOTE — Progress Notes (Signed)
Surgical Clinic History and Physical  Referring provider:  Farrel ConnersGutierrez, Colleen, CNM [address] [place], [place] [id number]  HISTORY OF PRESENT ILLNESS (HPI):  33 y.o. female presents for evaluation of bilateral breast pain. Patient reports she first experienced bilateral breast pain 13 years ago (33 years old), at which time she also palpated a breast mass, which prompted diagnosis of fibrocystic breasts at that time. Since then, she gave birth to her only child 8 years ago, for whom she attempted to breastfeed, but milk production was inadequate. Over the past 3 - 4 years, her B/L breast pain worsened, relieved initially x 6 months with Vitamin E, but her pain then resumed without any further relief despite continuing to take Vitamin E. She describes her symmetric B/L breast pain begins 2 weeks prior to menstruation, lessens 1 week prior to menstruation, and resolves with onset of menstruation (such as today). She appreciated a Right breast mass 1 month ago, but today cannot appreciate any mass. She denies any nipple discharge and has reduced caffeine and tried warm compresses and several different bras, none of which have reduced her chronic pain. She otherwise denies fever/chills, N/V, CP, or SOB.  PAST MEDICAL HISTORY (PMH):  Past Medical History:  Diagnosis Date  . Mastalgia   . Tobacco use     PAST SURGICAL HISTORY (PSH):  Past Surgical History:  Procedure Laterality Date  . INTRAUTERINE DEVICE (IUD) INSERTION  2011  . IUD REMOVAL  11/2010  . Nexplanon  11/25/2010/ 09/26/2014   [facility]    MEDICATIONS:  Prior to Admission medications   Medication Sig Start Date End Date Taking? Authorizing Provider  etonogestrel (NEXPLANON) 68 MG IMPL implant 1 each by Subdermal route once.   Yes [provider]  ibuprofen (ADVIL,MOTRIN) 800 MG tablet Take by mouth. 06/06/17  Yes [provider]  Multiple Vitamin (MULTIVITAMIN) tablet Take 1 tablet by mouth daily.   Yes  [provider]  Multiple Vitamins-Iron (MULTIVITAMINS WITH IRON) TABS tablet Take 1 tablet by mouth daily. 65mg    Yes [provider]  vitamin C (ASCORBIC ACID) 500 MG tablet Take 1,000 mg by mouth daily.    Yes [provider]  vitamin E 100 UNIT capsule Take 400 Units by mouth 2 (two) times daily.    Yes [provider]  dicyclomine (BENTYL) 20 MG tablet Take by mouth. 06/06/17 06/16/17  [provider]    ALLERGIES:  No Known Allergies   SOCIAL HISTORY:  Social History   Socioeconomic History  . Marital status: Divorced    Spouse name: Not on file  . Number of children: 1  . Years of education: Not on file  . Highest education level: Not on file  Occupational History  . Occupation: International aid/development workerassistant manager  Social Needs  . Financial resource strain: Not on file  . Food insecurity:    Worry: Not on file    Inability: Not on file  . Transportation needs:    Medical: Not on file    Non-medical: Not on file  Tobacco Use  . Smoking status: Current Every Day Smoker    Packs/day: 0.50    Years: 12.00    Pack years: 6.00  . Smokeless tobacco: Never Used  . Tobacco comment: stopped smoking with pregnancy, but restarted  Substance and Sexual Activity  . Alcohol use: Yes    Comment: 0-3 shots of liquor/night  . Drug use: No  . Sexual activity: Yes    Partners: Male    Birth  control/protection: Implant  Lifestyle  . Physical activity:    Days per week: 0 days    Minutes per session: 0 min  . Stress: Not on file  Relationships  . Social connections:    Talks on phone: Not on file    Gets together: Not on file    Attends religious service: Not on file    Active member of club or organization: Not on file    Attends meetings of clubs or organizations: Not on file    Relationship status: Not on file  . Intimate partner violence:    Fear of current or ex partner: Not on file    Emotionally abused: Not on file    Physically abused: Not on  file    Forced sexual activity: Not on file  Other Topics Concern  . Not on file  Social History Narrative  . Not on file    The patient currently resides (home / rehab facility / nursing home): Home The patient normally is (ambulatory / bedbound): Ambulatory  FAMILY HISTORY:  Family History  Problem Relation Age of Onset  . Diabetes Mother        Type 2  . Hypertension Mother   . Hypothyroidism Mother   . COPD Mother   . Other Father        does not know biological father    Otherwise negative/non-contributory.  REVIEW OF SYSTEMS:  Constitutional: denies any other weight loss, fever, chills, or sweats  Eyes: denies any other vision changes, history of eye injury  ENT: denies sore throat, hearing problems  Respiratory: denies shortness of breath, wheezing  Cardiovascular: denies chest pain, palpitations  Breasts: pain, nipple drainage, and masses as per HPI Gastrointestinal: denies abdominal pain, N/V, or diarrhea Musculoskeletal: denies any other joint pains or cramps  Skin: Denies any other rashes or skin discolorations Neurological: denies any other headache, dizziness, weakness  Psychiatric: Denies any other depression, anxiety   All other review of systems were otherwise negative   VITAL SIGNS:  BP 127/85   Pulse 90   Temp 97.7 F (36.5 C) (Skin)   Ht 5\' 3"  (1.6 m)   Wt 163 lb (73.9 kg)   LMP 03/11/2018 (Approximate)   SpO2 98%   BMI 28.87 kg/m    PHYSICAL EXAM:  Constitutional:  -- Overweight non-obese body habitus  -- Awake, alert, and oriented x3  Eyes:  -- Pupils equally round and reactive to light  -- No scleral icterus  Ear, nose, throat:  -- No jugular venous distension -- No nasal drainage, bleeding Pulmonary:  -- No crackles  -- Equal breath sounds bilaterally -- Breathing non-labored at rest Cardiovascular:  -- S1, S2 present  -- No pericardial rubs  Breasts: -- Bilaterally non-tender to palpation -- No discrete palpable breast mass  appreciated beyond deep chest wall nodularity -- No appreciable nipple drainage or axillary lymphadenopathy Gastrointestinal:  -- Abdomen soft, nontender, non-distended, no guarding/rebound  -- No abdominal masses appreciated, pulsatile or otherwise  Musculoskeletal and Integumentary:  -- Wounds or skin discoloration: None appreciated -- Extremities: B/L UE and LE FROM, hands and feet warm, no edema  Neurologic:  -- Motor function: Intact and symmetric -- Sensation: Intact and symmetric  Labs:  CBC: No results found for: WBC, RBC BMP:  Lab Results  Component Value Date   GLUCOSE 91 12/01/2014   CO2 25 12/01/2014   BUN 7 12/01/2014   CREATININE 0.64 12/01/2014   CALCIUM 9.1 12/01/2014  Imaging studies: Currently no pertinent imaging studies available for review   Assessment/Plan:  33 y.o. female with chronic bilateral breast pain and what appears to be resolved Right breast mass, complicated by co-morbidities including chronic ongoing tobacco abuse (smoking).   - differential diagnoses for Right breast mass discussed   - follow up pending/ordered mammogram and ultrasound, scheduled for this Friday, 2/14  - return to clinic in 1 week to discuss results of mammogram/ultrasound  - instructed to call if any questions or concerns  All of the above recommendations were discussed with the patient and patient's "best friend", and all of patient's and "best friend"'s questions were answered to their expressed satisfaction.  Thank you for the opportunity to participate in this patient's care.  -- Scherrie Gerlach Earlene Plater, MD, RPVI Max: [facility] General Surgery - Partnering for exceptional care. Office: [phone]

## 2018-03-23 NOTE — Patient Instructions (Signed)
Return in one week after mammogram scheduled on 03/26/2018.  The patient is aware to call back for any questions or concerns.

## 2018-03-26 ENCOUNTER — Ambulatory Visit
Admission: RE | Admit: 2018-03-26 | Discharge: 2018-03-26 | Disposition: A | Discharge status: Home / Self Care | Payer: BLUE CROSS/BLUE SHIELD | Source: Ambulatory Visit | Attending: Certified Nurse Midwife | Admitting: Certified Nurse Midwife

## 2018-03-26 DIAGNOSIS — N6310 Unspecified lump in the right breast, unspecified quadrant: Secondary | ICD-10-CM | POA: Insufficient documentation

## 2018-03-26 DIAGNOSIS — N644 Mastodynia: Secondary | ICD-10-CM | POA: Insufficient documentation

## 2018-03-30 ENCOUNTER — Other Ambulatory Visit: Payer: Self-pay

## 2018-03-30 ENCOUNTER — Encounter: Payer: Self-pay | Admitting: Surgery

## 2018-03-30 ENCOUNTER — Ambulatory Visit (INDEPENDENT_AMBULATORY_CARE_PROVIDER_SITE_OTHER): Payer: BLUE CROSS/BLUE SHIELD | Admitting: Surgery

## 2018-03-30 VITALS — BP 111/82 | HR 120 | Temp 97.9°F | Ht 63.0 in | Wt 161.0 lb

## 2018-03-30 DIAGNOSIS — N644 Mastodynia: Secondary | ICD-10-CM | POA: Diagnosis not present

## 2018-03-30 NOTE — Progress Notes (Signed)
Surgical Clinic Progress/Follow-up Note   HPI:  33 y.o. Female presents to clinic for follow-up evaluation after completion of her recent breast imaging studies. Patient reports the Right medial breast mass she previously appreciated has since completely resolved. Patient also currently describes relief from her chronic bilateral breast pain, though acknowledges she has begun menstruation, during which she typically has experienced relief from her chronic bilateral breast pain. Patient otherwise denies any N/V, fever/chills, CP, or SOB.  Review of Systems:  Constitutional: denies any other weight loss, fever, chills, or sweats  Eyes: denies any other vision changes, history of eye injury  ENT: denies sore throat, hearing problems  Respiratory: denies shortness of breath, wheezing  Cardiovascular: denies chest pain, palpitations  Gastrointestinal: abdominal pain, N/V, and bowel function as per HPI Musculoskeletal: denies any other joint pains or cramps  Skin: Denies any other rashes or skin discolorations  Neurological: denies any other headache, dizziness, weakness  Psychiatric: denies any other depression, anxiety  All other review of systems: otherwise negative   Vital Signs:  BP 111/82   Pulse (!) 120   Temp 97.9 F (36.6 C) (Skin)   Ht 5\' 3"  (1.6 m)   Wt 161 lb (73 kg)   LMP 03/23/2018   SpO2 98%   BMI 28.52 kg/m    Physical Exam:  Constitutional:  -- Normal body habitus  -- Awake, alert, and oriented x3  Eyes:  -- Pupils equally round and reactive to light  -- No scleral icterus  Ear, nose, throat:  -- No jugular venous distension  -- No nasal drainage, bleeding Pulmonary:  -- No crackles -- Equal breath sounds bilaterally -- Breathing non-labored at rest Cardiovascular:  -- S1, S2 present  -- No pericardial rubs  Breasts: -- B/L breasts non-tender to palpation with no masses or nipple discharge -- No axillary lymphadenopathy Gastrointestinal:  -- Soft,  nontender, non-distended, no guarding/rebound  -- No abdominal masses appreciated, pulsatile or otherwise  Musculoskeletal / Integumentary:  -- Wounds or skin discoloration: None appreciated  -- Extremities: B/L UE and LE FROM, hands and feet warm, no edema  Neurologic:  -- Motor function: intact and symmetric  -- Sensation: intact and symmetric   Imaging:  Bilateral Diagnostic Mammogram with Focal Right Breast Ultrasound (03/26/2018) ACR Breast Density Category c: The breast tissue is heterogeneously dense, which may obscure small masses.  No suspicious mass, malignant type microcalcifications, or distortion detected in either breast. Spot tangential view of the area of clinical concern in the right breast shows normal fibroglandular tissue.  Mammographic images were processed with CAD.  On physical exam, I do not palpate a mass in the lower-inner quadrant of the right breast.  Targeted ultrasound is performed, showing normal tissue in the area of clinical concern in the lower-inner quadrant of right breast. No solid or cystic mass, abnormal shadowing or distortion visualized.  Assessment:  33 y.o. yo Female with a problem list including...  Patient Active Problem List   Diagnosis Date Noted  . Mastalgia   . Tobacco use   . Tobacco dependence due to cigarettes 06/12/2017    presents to clinic for follow-up evaluation after completion of her recent breast imaging studies without any masses or concerning abnormalities otherwise despite chronic B/L breast pain.  Plan:   - recent imaging results discussed  - will schedule screening mammogram in 10 years  - discussed possible prn drainage of any painful cyst(s) that may arise  - strategies for management of cyclical B/L breast pain  were also discussed  - return to clinic as needed and in 10 years following screening mammogram  - instructed to call office if any questions or concerns  All of the above recommendations were  discussed with the patient, and all of patient's questions were answered to her expressed satisfaction.  -- Scherrie Gerlach Earlene Plater, MD, RPVI [place]: [facility] - Partnering for exceptional care. Office: [phone]

## 2018-03-30 NOTE — Patient Instructions (Addendum)
Return in 10 years for screening mammogram. We will see you a letter in mail.  Patient to call if she has any problems with her breast . The patient is aware to call back for any questions or concerns.

## 2018-03-31 ENCOUNTER — Encounter: Payer: Self-pay | Admitting: Surgery

## 2018-04-13 ENCOUNTER — Encounter: Payer: Self-pay | Admitting: Surgery

## 2018-04-13 ENCOUNTER — Ambulatory Visit (INDEPENDENT_AMBULATORY_CARE_PROVIDER_SITE_OTHER): Payer: BLUE CROSS/BLUE SHIELD | Admitting: Surgery

## 2018-04-13 ENCOUNTER — Other Ambulatory Visit: Payer: Self-pay

## 2018-04-13 ENCOUNTER — Ambulatory Visit: Payer: Self-pay

## 2018-04-13 VITALS — BP 129/87 | HR 94 | Temp 97.9°F | Ht 64.0 in | Wt 164.0 lb

## 2018-04-13 DIAGNOSIS — N644 Mastodynia: Secondary | ICD-10-CM

## 2018-04-13 NOTE — Patient Instructions (Signed)
Return as needed

## 2018-04-13 NOTE — Progress Notes (Signed)
Surgical Clinic Progress/Follow-up Note   HPI:  33 y.o. Female presents to clinic for evaluation of her Left >> Right breast pain. Patient says she was told by a family member that breast cysts can sometimes contribute to breast pain, for which aspiration can be performed to relieve such pain. She accordingly requested evaluation today with hopes for therapeutic aspiration drainage/decompression. She otherwise denies fever/chills, nipple drainage, palpable breast mass, CP, or SOB.  Review of Systems:  Constitutional: denies any other weight loss, fever, chills, or sweats  Eyes: denies any other vision changes, history of eye injury  ENT: denies sore throat, hearing problems  Respiratory: denies shortness of breath, wheezing  Cardiovascular: denies chest pain, palpitations  Breasts: pain, mass(es), and nipple drainage as per interval history Gastrointestinal: denies abdominal pain, N/V, or diarrhea Musculoskeletal: denies any other joint pains or cramps  Skin: Denies any other rashes or skin discolorations  Neurological: denies any other headache, dizziness, weakness  Psychiatric: denies any other depression, anxiety  All other review of systems: otherwise negative   Vital Signs:  BP 129/87   Pulse 94   Temp 97.9 F (36.6 C) (Skin)   Ht 5\' 4"  (1.626 m)   Wt 164 lb (74.4 kg)   LMP 03/23/2018   SpO2 98%   BMI 28.15 kg/m    Physical Exam:  Constitutional:  -- Normal body habitus  -- Awake, alert, and oriented x3  Eyes:  -- Pupils equally round and reactive to light  -- No scleral icterus  Ear, nose, throat:  -- No jugular venous distension  -- No nasal drainage, bleeding Pulmonary:  -- No crackles -- Equal breath sounds bilaterally -- Breathing non-labored at rest Cardiovascular:  -- S1, S2 present  -- No pericardial rubs Breasts: -- Bilateral Left > Right breast tenderness to palpation -- No appreciable palpable mass(es), nipple drainage, erythema, fluctuance, or  axillary lymphadenopathy Gastrointestinal:  -- Soft, nontender, non-distended, no guarding/rebound  -- No abdominal masses appreciated, pulsatile or otherwise  Musculoskeletal / Integumentary:  -- Wounds or skin discoloration: None appreciated  -- Extremities: B/L UE and LE FROM, hands and feet warm, no edema  Neurologic:  -- Motor function: intact and symmetric  -- Sensation: intact and symmetric   Imaging:  In-Office Left Breast Ultrasound (04/13/2018) No focal Left breast cysts amenable to aspiration appreciated on in-office ultrasound today.  Assessment:  33 y.o. yo Female with a problem list including...  Patient Active Problem List   Diagnosis Date Noted  . Mastalgia   . Tobacco use   . Tobacco dependence due to cigarettes 06/12/2017    presents to clinic for follow-up evaluation of chronic B/L breast pain without any evidence on in-office ultrasound today of breast cyst amenable to decompressive aspiration drainage.  Plan:   - results of Left breast ultrasound discussed              - will schedule screening mammogram in 31 years             - strategies for management of cyclical B/L breast pain were also discussed  - discussed with patient consideration for switching from hormonal to non-hormonal contraception (IUD, condoms, etc), which patient says she plans to discuss with her ob/gyn             - return to clinic as needed and in 31 years following screening mammogram             - instructed to call office if any questions or  concerns  All of the above recommendations were discussed with the patient, and all of patient's questions were answered to her expressed satisfaction.  -- Scherrie Gerlach Earlene Plater, MD, RPVI [place]: [facility] - Partnering for exceptional care. Office: [phone]

## 2018-04-19 ENCOUNTER — Encounter: Payer: Self-pay | Admitting: Surgery

## 2018-04-29 ENCOUNTER — Encounter: Payer: Self-pay | Admitting: Certified Nurse Midwife

## 2018-04-29 ENCOUNTER — Other Ambulatory Visit: Payer: Self-pay

## 2018-04-29 ENCOUNTER — Ambulatory Visit (INDEPENDENT_AMBULATORY_CARE_PROVIDER_SITE_OTHER): Payer: BLUE CROSS/BLUE SHIELD | Admitting: Certified Nurse Midwife

## 2018-04-29 VITALS — BP 104/80 | Ht 63.0 in | Wt 160.0 lb

## 2018-04-29 DIAGNOSIS — Z3049 Encounter for surveillance of other contraceptives: Secondary | ICD-10-CM

## 2018-04-29 DIAGNOSIS — Z3046 Encounter for surveillance of implantable subdermal contraceptive: Secondary | ICD-10-CM

## 2018-04-29 NOTE — Progress Notes (Signed)
   GYNECOLOGY PROCEDURE NOTE Karen Carey is a 33 year old G1 P1 WF who presents today for a removal of her Nexplanon. Her current Nexplanon was placed 09/18/2017. She would like to remove the Nexplanon to see if her mastalgia improves. She is not currently sexually active, but is considering non hormonal contraception if her mastalgia decreases off the Nexplanon. Nexplanon removal discussed in detail  Patient understands risks and desires to proceed.  Verbal consent obtained.  Patient is certain she wants the Nexplanon removed.  All questions answered.  Procedure: Patient placed in dorsal supine with left arm above head, elbow flexed at 90 degrees, arm resting on examination table.  Nexplanon identified without problems.  Betadine scrub x2.  1 ml of 1% lidocaine injected under distal end of Nexplanon device without problems.  Sterile gloves applied.  Small 0.5cm incision made at distal tip of implanon device with 11 blade scalpel.  Nexplanon brought to incision and grasped with a small kelly clamp. It was removed intact without problems.  Pressure applied to incision.  Hemostasis obtained.  Steri-strips applied, followed by bandaid and compression dressing.  Patient tolerated procedure well.  No complications.   Assessment: 33 y.o. year old female now s/p uncomplicated Nexplanon removal.  Plan: 1.  Patient given post procedure precautions and asked to call for fever, chills, redness or drainage from her incision, bleeding from incision.  She understands she will likely have a small bruise near site of removal and can remove compression tomorrow, change bandaid daily x 3 days and remove steri-strips in approximately 1 week.  2) Contraception-condoms if needed. She is considering Paraguard  3) Return for annual in May 2020.  J2001 for lidocaine block, [id number] for nexplanon removal

## 2018-06-14 ENCOUNTER — Ambulatory Visit: Payer: BLUE CROSS/BLUE SHIELD | Admitting: Certified Nurse Midwife

## 2019-01-27 NOTE — Progress Notes (Signed)
Gynecology Annual Exam  PCP: Patient, No Pcp Per  Chief Complaint:  Chief Complaint  Patient presents with  . Gynecologic Exam    non hormonal IUD    History of Present Illness: Karen Carey is a 33 y.o. White female, G1P1001, who presents for her annual gyn exam.  Since her Nexplanon was removed on 04/29/2018, her breast pain has improved significantly and her menses have become lighter in flow. Her menses are slightly irregular, they occur every month =/- 1 week, and they last 4 days with 1 heavier day requiring a tampon change every 3-4 hours. She does not have intermenstrual bleeding. Her last menstrual period was 01/08/2019. She has mild dysmenorrhea and back pain and takes Midol or ibuprofen with relief Last pap smear: 06/12/2017, results were NIL   The patient is sexually active. She currently uses condoms for contraception. She is interested in the Paragard IUD   Her past medical history is remarkable for a TSVD in 2011.  The patient does perform self breast exams. Her last mammogram was NA.  There is no family history of breast cancer.   There is no family history of ovarian cancer.   The patient reports smoking. She smokes 1 pack per day. Started smoking at age 81, but stopped smoking during her pregnancy cold [place], but then resumed smoking. Tried using the nicotine patch but she stopped due to muscle twitching  She reports drinking alcohol. She reports have 0-3 shots, 3-4 nights/week.   She denies illegal drug use.  The patient does not exercise. Except for walking at work.  The patient denies current symptoms of depression.    Review of Systems: Review of Systems  Constitutional: Negative for chills, fever and weight loss.  HENT: Negative for congestion, sinus pain and sore throat.   Eyes: Negative for blurred vision and pain.  Respiratory: Negative for hemoptysis, shortness of breath and wheezing.   Cardiovascular: Negative for chest pain,  palpitations and leg swelling.  Gastrointestinal: Negative for abdominal pain, blood in stool, diarrhea, heartburn, nausea and vomiting.  Genitourinary: Negative for dysuria, frequency, hematuria and urgency.  Musculoskeletal: Negative for back pain, joint pain and myalgias.  Skin: Negative for itching and rash.  Neurological: Negative for dizziness, tingling and headaches.  Endo/Heme/Allergies: Negative for environmental allergies and polydipsia. Does not bruise/bleed easily.       Negative for hirsutism   Psychiatric/Behavioral: Negative for depression. The patient is not nervous/anxious and does not have insomnia.   Breast: tenderness bilaterally.. Negative for masses or nipple discharge  Past Medical History:  Past Medical History:  Diagnosis Date  . Mastalgia   . Tobacco use     Past Surgical History:  Past Surgical History:  Procedure Laterality Date  . INTRAUTERINE DEVICE (IUD) INSERTION  2011  . IUD REMOVAL  11/2010  . Nexplanon  11/25/2010/ 09/26/2014   [facility]    Family History:  Family History  Problem Relation Age of Onset  . Diabetes Mother        Type 2  . Hypertension Mother   . Hypothyroidism Mother   . COPD Mother   . Other Father        does not know biological father    Social History:  Social History   Socioeconomic History  . Marital status: Divorced    Spouse name: Not on file  . Number of children: 1  . Years of education: Not on file  . Highest education level: Not on  file  Occupational History  . Occupation: International aid/development worker  Tobacco Use  . Smoking status: Current Every Day Smoker    Packs/day: 1.00    Years: 12.00    Pack years: 12.00  . Smokeless tobacco: Never Used  . Tobacco comment: stopped smoking with pregnancy, but restarted  Substance and Sexual Activity  . Alcohol use: Yes    Alcohol/week: 12.0 standard drinks    Types: 12 Shots of liquor per week    Comment: 0-3 shots of liquor/night on 4 nights/week  . Drug use: No    . Sexual activity: Yes    Partners: Male    Birth control/protection: Condom  Other Topics Concern  . Not on file  Social History Narrative  . Not on file   Social Determinants of Health   Financial Resource Strain:   . Difficulty of Paying Living Expenses: Not on file  Food Insecurity:   . Worried About Programme researcher, broadcasting/film/video in the Last Year: Not on file  . Ran Out of Food in the Last Year: Not on file  Transportation Needs:   . Lack of Transportation (Medical): Not on file  . Lack of Transportation (Non-Medical): Not on file  Physical Activity:   . Days of Exercise per Week: Not on file  . Minutes of Exercise per Session: Not on file  Stress:   . Feeling of Stress : Not on file  Social Connections:   . Frequency of Communication with Friends and Family: Not on file  . Frequency of Social Gatherings with Friends and Family: Not on file  . Attends Religious Services: Not on file  . Active Member of Clubs or Organizations: Not on file  . Attends Banker Meetings: Not on file  . Marital Status: Not on file  Intimate Partner Violence:   . Fear of Current or Ex-Partner: Not on file  . Emotionally Abused: Not on file  . Physically Abused: Not on file  . Sexually Abused: Not on file    Allergies:  No Known Allergies  Medications: Current Outpatient Medications:  .  ibuprofen (ADVIL,MOTRIN) 800 MG tablet, Take by mouth., Disp: , Rfl:  .  Multiple Vitamins-Iron (MULTIVITAMINS WITH IRON) TABS tablet, Take 1 tablet by mouth daily. 65mg , Disp: , Rfl:  .  vitamin C (ASCORBIC ACID) 500 MG tablet, Take 1,000 mg by mouth daily. , Disp: , Rfl:  Ferrous sulfate  Physical Exam Vitals: BP 124/80   Pulse 84   Temp (!) 97 F (36.1 C)   Ht 5\' 2"  (1.575 m)   Wt 156 lb (70.8 kg)   LMP 01/08/2019 (Exact Date)   BMI 28.53 kg/m General:WF in  NAD HEENT: normocephalic, anicteric Neck: no thyroid enlargement, no palpable nodules, no cervical lymphadenopathy  Pulmonary: No  increased work of breathing, CTAB Cardiovascular: RRR, without murmur  Breast: Breast symmetrical, tenderness bilaterally , no palpable nodules or masses, no skin or nipple retraction present, no nipple discharge.  No axillary, infraclavicular or supraclavicular lymphadenopathy. Abdomen: Soft, non-tender, non-distended.  Umbilicus without lesions.  No hepatomegaly or masses palpable. No evidence of hernia. Genitourinary:  External: Normal external female genitalia.  Normal urethral meatus, normal Bartholin's and  Skene's glands.    Vagina: Normal vaginal mucosa, no evidence of prolapse.    Cervix: Grossly normal in appearance, no bleeding, non-tender  Uterus: Anteverted, normal size, shape, and consistency, mobile, and non-tender  Adnexa: No adnexal masses, non-tender  Rectal: deferred  Lymphatic: no evidence of inguinal lymphadenopathy Extremities:  no edema, erythema, or tenderness Neurologic: Grossly intact Psychiatric: mood appropriate, affect full     Assessment: 33 y.o. G1P1001 normal gyn exam Tobacco use Contraceptive counseling  Plan:    1) Breast cancer screening - recommend monthly self breast exam.   2) STI screening was offered and accepted  3) Cervical cancer screening - Pap was not done. ASCCP guidelines and rational discussed.  Patient opts for every 3 years screening interval. Next due in 2 years  4) Contraception -Discussed the Paraguard IUD, risks and benefits. Aware of risks of expulsion, perforation, infection, and bleeding irregularities. Explained effectiveness and MOA.Marland Kitchen. She wishes to insert IUD today. See procedure note below   5) Routine healthcare maintenance including cholesterol and diabetes screening UTD.  6) Discussed smoking cessation, and the use of nicotine lozenges or gum, Wellbutrin and Chantix. Explained possible side effects.  Discussed the 1-800-Quit now helpline.   Farrel Connersolleen Fredonia Casalino, CNM     GYNECOLOGY OFFICE PROCEDURE NOTE  Lurline IdolRobin A  Gupton is a 33 y.o. G1P1001 here for Paragard IUD insertion. No GYN concerns.  Last pap smear was 1 year ago  and was normal.  IUD Insertion Procedure Note Patient identified, informed consent performed, consent signed.   Discussed risks of irregular bleeding, cramping, infection, expulsion,malpositioning or misplacement of the IUD outside the uterus which may require further procedure such as laparoscopy. Time out was performed.   On bimanual exam, uterus was Anteverted Speculum placed in the vagina.  Cervix visualized.  Cleaned with Betadine x 2. Cervix was sprayed with Hurricaine anesthetic and  grasped anteriorly with a single tooth tenaculum.  Uterus sounded to 8 cm with a metal sound but I was unable to insert the .Paragard  IUD after several attempts due to cervical stenosis. Tenaculum was removed, and silver nitrate was applied to tenaculum sites for hemostasis.  Patient tolerated procedure well.   Patient was given post-procedure instructions.Plan was to try insertion when she is on her menses. This was scheduled.    Farrel ConnersColleen Myleah Cavendish, CNM 01/30/19

## 2019-01-28 ENCOUNTER — Other Ambulatory Visit: Payer: Self-pay

## 2019-01-28 ENCOUNTER — Telehealth: Payer: Self-pay | Admitting: Certified Nurse Midwife

## 2019-01-28 ENCOUNTER — Encounter: Payer: Self-pay | Admitting: Certified Nurse Midwife

## 2019-01-28 ENCOUNTER — Ambulatory Visit (INDEPENDENT_AMBULATORY_CARE_PROVIDER_SITE_OTHER): Payer: BC Managed Care – PPO | Admitting: Certified Nurse Midwife

## 2019-01-28 ENCOUNTER — Other Ambulatory Visit (HOSPITAL_COMMUNITY)
Admission: RE | Admit: 2019-01-28 | Discharge: 2019-01-28 | Disposition: A | Discharge status: Home / Self Care | Payer: BC Managed Care – PPO | Source: Ambulatory Visit | Attending: Certified Nurse Midwife | Admitting: Certified Nurse Midwife

## 2019-01-28 VITALS — BP 124/80 | HR 84 | Temp 97.0°F | Ht 62.0 in | Wt 156.0 lb

## 2019-01-28 DIAGNOSIS — Z01419 Encounter for gynecological examination (general) (routine) without abnormal findings: Secondary | ICD-10-CM

## 2019-01-28 DIAGNOSIS — Z30430 Encounter for insertion of intrauterine contraceptive device: Secondary | ICD-10-CM

## 2019-01-28 DIAGNOSIS — Z113 Encounter for screening for infections with a predominantly sexual mode of transmission: Secondary | ICD-10-CM | POA: Insufficient documentation

## 2019-01-28 NOTE — Telephone Encounter (Signed)
Patient scheduled 12/29 with [facility] for Paragard insertion.

## 2019-01-30 ENCOUNTER — Encounter: Payer: Self-pay | Admitting: Certified Nurse Midwife

## 2019-01-31 NOTE — Telephone Encounter (Signed)
Patient is reschedule to 02/03/19 at 10:50 with [facility] for paraguard placement

## 2019-01-31 NOTE — Telephone Encounter (Signed)
Noted. Paragard reserved for this patient. 

## 2019-02-01 LAB — CERVICOVAGINAL ANCILLARY ONLY
Chlamydia: NEGATIVE
Comment: NEGATIVE
Comment: NORMAL
Neisseria Gonorrhea: NEGATIVE

## 2019-02-02 ENCOUNTER — Telehealth: Payer: Self-pay

## 2019-02-02 NOTE — Telephone Encounter (Signed)
Called [facility] at [phone]; spoke c Ashlyn; adv of failed insertion 01/28/19 d/t cx stenosis; questions answered; Case # [id number].  Replacement should be here in the next few days or they will contact me for more information/f/u.

## 2019-02-03 ENCOUNTER — Ambulatory Visit (INDEPENDENT_AMBULATORY_CARE_PROVIDER_SITE_OTHER): Payer: BC Managed Care – PPO | Admitting: Obstetrics and Gynecology

## 2019-02-03 ENCOUNTER — Encounter: Payer: Self-pay | Admitting: Obstetrics and Gynecology

## 2019-02-03 ENCOUNTER — Other Ambulatory Visit: Payer: Self-pay

## 2019-02-03 VITALS — BP 122/74 | Ht 62.0 in | Wt 156.0 lb

## 2019-02-03 DIAGNOSIS — Z30430 Encounter for insertion of intrauterine contraceptive device: Secondary | ICD-10-CM

## 2019-02-03 MED ORDER — PARAGARD INTRAUTERINE COPPER IU IUD: INTRAUTERINE_SYSTEM | Freq: Once | INTRAUTERINE | Status: DC

## 2019-02-03 NOTE — Patient Instructions (Addendum)
I value your feedback and entrusting us with your care. If you get a [facility] patient survey, I would appreciate you taking the time to let us know about your experience today. Thank you!  As of January 20, 2019, your lab results will be released to your MyChart immediately, before I even have a chance to see them. Please give me time to review them and contact you if there are any abnormalities. Thank you for your patience.   [facility] [phone]  Instructions after IUD insertion  Most women experience no significant problems after insertion of an IUD, however minor cramping and spotting for a few days is common. Cramps may be treated with ibuprofen 800mg every 8 hours or Tylenol 650 mg every 4 hours. Contact [facility] immediately if you experience any of the following symptoms during the next week: temperature >99.6 degrees, worsening pelvic pain, abdominal pain, fainting, unusually heavy vaginal bleeding, foul vaginal discharge, or if you think you have expelled the IUD.  Nothing inserted in the vagina for 48 hours. You will be scheduled for a follow up visit in approximately four weeks.  You should check monthly to be sure you can feel the IUD strings in the upper vagina. If you are having a monthly period, try to check after each period. If you cannot feel the IUD strings,  contact [facility] immediately so we can do an exam to determine if the IUD has been expelled.   Please use backup protection until we can confirm the IUD is in place.  Call [facility] if you are exposed to or diagnosed with a sexually transmitted infection, as we will need to discuss whether it is safe for you to continue using an IUD.   

## 2019-02-03 NOTE — Progress Notes (Signed)
   Chief Complaint  Patient presents with  . Contraception     IUD PROCEDURE NOTE:  Karen Carey is a 33 y.o. G1P1001 here for Paragard  IUD insertion for [facility]. Insertion attempted at annual 01/28/19 but failed due to cx stenosis. Instructed to return with menses.  Neg STD labs 12/20.    BP 122/74   Ht 5\' 2"  (1.575 m)   Wt 156 lb (70.8 kg)   LMP 01/31/2019   BMI 28.53 kg/m   IUD Insertion Procedure Note Patient identified, informed consent performed, consent signed.   Discussed risks of irregular bleeding, cramping, infection, malpositioning or misplacement of the IUD outside the uterus which may require further procedure such as laparoscopy, risk of failure <1%. Time out was performed.    Speculum placed in the vagina.  Cervix visualized.  Cleaned with Betadine x 2.  Grasped anteriorly with a single tooth tenaculum.  Uterus sounded to 8.0 cm.   IUD placed per manufacturer's recommendations.  Strings trimmed to 3 cm. Tenaculum was removed, good hemostasis noted.  Patient tolerated procedure well.   ASSESSMENT:  Encounter for IUD insertion - Plan: paragard intrauterine copper IUD   Meds ordered this encounter  Medications  . paragard intrauterine copper IUD     Plan:  Patient was given post-procedure instructions.  She was advised to have backup contraception for one week.   Call if you are having increasing pain, cramps or bleeding or if you have a fever greater than 100.4 degrees F., shaking chills, nausea or vomiting. Patient was also asked to check IUD strings periodically and follow up in 4 weeks for IUD check.  Return in about 4 weeks (around 03/03/2019) for IUD f/u.  Jarome Trull B. Ridhi Hoffert, PA-C 02/03/2019 11:19 AM

## 2019-02-08 ENCOUNTER — Ambulatory Visit: Payer: BC Managed Care – PPO | Admitting: Certified Nurse Midwife

## 2019-02-08 NOTE — Telephone Encounter (Signed)
Replacement received.

## 2019-02-25 ENCOUNTER — Telehealth: Payer: Self-pay

## 2019-02-25 NOTE — Telephone Encounter (Signed)
Pt called triage line stating she had the Paragard IUD inserted on 12/24, she has a follow up with [facility] on 1/21. She has been experiencing bleeding, sm. amount of cramping and discharge w/slight odor.   I advised and answered questions regarding the IUD getting settled in the uterus, bleeding will subside and she should start having normal cycles soon. The discharge is normal and may be thicker with IUD. I told her it could also possibly be BV. Pt states an understanding and was thankful for the call.   She did not ask for abx, but will see [facility] on 1/21.

## 2019-03-03 ENCOUNTER — Encounter: Payer: Self-pay | Admitting: Obstetrics and Gynecology

## 2019-03-03 ENCOUNTER — Ambulatory Visit (INDEPENDENT_AMBULATORY_CARE_PROVIDER_SITE_OTHER): Payer: BC Managed Care – PPO | Admitting: Obstetrics and Gynecology

## 2019-03-03 ENCOUNTER — Other Ambulatory Visit: Payer: Self-pay

## 2019-03-03 VITALS — BP 120/90 | Ht 62.0 in | Wt 158.0 lb

## 2019-03-03 DIAGNOSIS — Z30431 Encounter for routine checking of intrauterine contraceptive device: Secondary | ICD-10-CM

## 2019-03-03 DIAGNOSIS — N946 Dysmenorrhea, unspecified: Secondary | ICD-10-CM | POA: Diagnosis not present

## 2019-03-03 NOTE — Patient Instructions (Signed)
I value your feedback and entrusting us with your care. If you get a [facility] patient survey, I would appreciate you taking the time to let us know about your experience today. Thank you!  As of January 20, 2019, your lab results will be released to your MyChart immediately, before I even have a chance to see them. Please give me time to review them and contact you if there are any abnormalities. Thank you for your patience.  

## 2019-03-03 NOTE — Progress Notes (Signed)
   Chief Complaint  Patient presents with  . IUD check     History of Present Illness:  Karen Carey is a 34 y.o. that had a Paragard IUD placed approximately 1 month ago. Since that time, she has had daily cramping and spotting. Had menses at normal time, but it was longer and heavier than usual. Cramping was severe, bringing pt to tears. Not relieved with NSAIDs. No work missed but would have if she could. Menses stopped yesterday. Minimal cramping today, no bleeding. Pt was sex active once, no dyspareunia but did have slight bleeding.    Review of Systems  Constitutional: Negative for fever.  Gastrointestinal: Negative for blood in stool, constipation, diarrhea, nausea and vomiting.  Genitourinary: Positive for menstrual problem and pelvic pain. Negative for dyspareunia, dysuria, flank pain, frequency, hematuria, urgency, vaginal bleeding, vaginal discharge and vaginal pain.  Musculoskeletal: Negative for back pain.  Skin: Negative for rash.    Physical Exam:  BP 120/90   Ht 5\' 2"  (1.575 m)   Wt 158 lb (71.7 kg)   BMI 28.90 kg/m  Body mass index is 28.9 kg/m.  Pelvic exam:  Two IUD strings present seen coming from the cervical os. EGBUS, vaginal vault and cervix: within normal limits   Assessment:   Encounter for routine checking of intrauterine contraceptive device (IUD)--IUD strings in place.   Dysmenorrhea--severe sx with last menses. Sx improved today. Follow sx expectantly. If cramping/bleeding or severe pain recur/persist, will check GYN u/s for IUD placement.    Plan: F/u if any signs of infection or can no longer feel the strings.   Jayline Kilburg B. Zakari Couchman, PA-C 03/03/2019 11:07 AM

## 2019-03-29 NOTE — Telephone Encounter (Signed)
Nexplanon rcvd/charged 09/18/17

## 2019-04-18 NOTE — Telephone Encounter (Signed)
Paragard rcvd/charged 02/03/2019

## 2019-08-27 DIAGNOSIS — R197 Diarrhea, unspecified: Secondary | ICD-10-CM | POA: Diagnosis not present

## 2019-08-27 DIAGNOSIS — S46011A Strain of muscle(s) and tendon(s) of the rotator cuff of right shoulder, initial encounter: Secondary | ICD-10-CM | POA: Diagnosis not present

## 2019-08-27 DIAGNOSIS — T510X1A Toxic effect of ethanol, accidental (unintentional), initial encounter: Secondary | ICD-10-CM | POA: Diagnosis not present

## 2019-08-27 DIAGNOSIS — R112 Nausea with vomiting, unspecified: Secondary | ICD-10-CM | POA: Diagnosis not present

## 2019-09-10 DIAGNOSIS — R102 Pelvic and perineal pain: Secondary | ICD-10-CM | POA: Diagnosis not present

## 2019-09-10 DIAGNOSIS — N760 Acute vaginitis: Secondary | ICD-10-CM | POA: Diagnosis not present

## 2019-09-10 DIAGNOSIS — N939 Abnormal uterine and vaginal bleeding, unspecified: Secondary | ICD-10-CM | POA: Diagnosis not present

## 2019-09-10 DIAGNOSIS — B9689 Other specified bacterial agents as the cause of diseases classified elsewhere: Secondary | ICD-10-CM | POA: Diagnosis not present

## 2019-09-10 DIAGNOSIS — Z975 Presence of (intrauterine) contraceptive device: Secondary | ICD-10-CM | POA: Diagnosis not present

## 2019-09-10 DIAGNOSIS — F17210 Nicotine dependence, cigarettes, uncomplicated: Secondary | ICD-10-CM | POA: Diagnosis not present

## 2019-09-10 DIAGNOSIS — N898 Other specified noninflammatory disorders of vagina: Secondary | ICD-10-CM | POA: Diagnosis not present

## 2019-09-10 DIAGNOSIS — R1030 Lower abdominal pain, unspecified: Secondary | ICD-10-CM | POA: Diagnosis not present

## 2019-09-10 DIAGNOSIS — Z6826 Body mass index (BMI) 26.0-26.9, adult: Secondary | ICD-10-CM | POA: Diagnosis not present

## 2019-09-15 ENCOUNTER — Other Ambulatory Visit: Payer: Self-pay

## 2019-09-15 ENCOUNTER — Other Ambulatory Visit: Payer: Self-pay | Admitting: Obstetrics and Gynecology

## 2019-09-15 ENCOUNTER — Ambulatory Visit (INDEPENDENT_AMBULATORY_CARE_PROVIDER_SITE_OTHER): Payer: BC Managed Care – PPO | Admitting: Obstetrics and Gynecology

## 2019-09-15 ENCOUNTER — Encounter: Payer: Self-pay | Admitting: Obstetrics and Gynecology

## 2019-09-15 ENCOUNTER — Other Ambulatory Visit (INDEPENDENT_AMBULATORY_CARE_PROVIDER_SITE_OTHER): Payer: BC Managed Care – PPO

## 2019-09-15 VITALS — BP 110/80 | Ht 61.0 in | Wt 139.0 lb

## 2019-09-15 DIAGNOSIS — R102 Pelvic and perineal pain: Secondary | ICD-10-CM | POA: Insufficient documentation

## 2019-09-15 DIAGNOSIS — Z30431 Encounter for routine checking of intrauterine contraceptive device: Secondary | ICD-10-CM

## 2019-09-15 DIAGNOSIS — N939 Abnormal uterine and vaginal bleeding, unspecified: Secondary | ICD-10-CM

## 2019-09-15 DIAGNOSIS — N83202 Unspecified ovarian cyst, left side: Secondary | ICD-10-CM | POA: Insufficient documentation

## 2019-09-15 MED ORDER — DOXYCYCLINE HYCLATE 100 MG PO CAPS: 100.0000 mg | ORAL_CAPSULE | Freq: Two times a day (BID) | ORAL | 0 refills | Status: DC

## 2019-09-15 MED ORDER — CEFTRIAXONE SODIUM 250 MG IJ SOLR
250.0000 mg | Freq: Once | INTRAMUSCULAR | Status: AC
  Administered 2019-09-15: 250 mg via INTRAMUSCULAR

## 2019-09-15 MED ORDER — CEFTRIAXONE SODIUM 250 MG IJ SOLR: 250.0000 mg | Freq: Once | INTRAMUSCULAR | Status: DC

## 2019-09-15 NOTE — Patient Instructions (Signed)
I value your feedback and entrusting us with your care. If you get a [facility] patient survey, I would appreciate you taking the time to let us know about your experience today. Thank you!  As of January 20, 2019, your lab results will be released to your MyChart immediately, before I even have a chance to see them. Please give me time to review them and contact you if there are any abnormalities. Thank you for your patience.  

## 2019-09-15 NOTE — Addendum Note (Signed)
Addended by: Donnetta Hail on: 09/15/2019 10:51 AM   Modules accepted: Orders

## 2019-09-15 NOTE — Progress Notes (Signed)
Patient, No Pcp Per   Chief Complaint  Patient presents with  . IUD check    severe cramping, pt says she feels one of the strings, had cycle mid july and started bleeding end of july again and hasnt stopped    HPI:      Ms. Karen Carey is a 34 y.o. G1P1001 whose LMP was Patient's last menstrual period was 09/03/2019 (exact date)., presents today for severe pelvic pain and irregular bleeding, has IUD. Sx started 09/03/19 with heavy bleeding and pelvic pain. Pain has worsened to sharp pains, affecting sleep and work. Feels better lying down but very uncomfortable, not relieved by OTC and Rx NSAIDs. Bleeding is heavy flow. Went to ED 09/10/19 and had neg STD testing, neg UA, neg UPT. Diagnosed with BV and started on flagyl. Those vag sx improving. No urin sx. Has had n/v due to pain. No recent diarrhea/constipation. Hx of ovar cysts in past and pain is much worse.   She is sex active, no new partners. Having pain with sex since sx start. Usually only has some discomfort in certain positions. No bleeding with sex.   Paragard placed 12/20. Menses have been monthly, lasting 7 days, mod flow, no BTB, mild dysmen, usually improved with NSAIDs. Had normal menses 7/21 and then started bleeding 3 days after period (09/03/19).    Past Medical History:  Diagnosis Date  . Mastalgia   . Tobacco use     Past Surgical History:  Procedure Laterality Date  . INTRAUTERINE DEVICE (IUD) INSERTION  2011  . IUD REMOVAL  11/2010  . Nexplanon  11/25/2010/ 09/26/2014   [facility]    Family History  Problem Relation Age of Onset  . Diabetes Mother        Type 2  . Hypertension Mother   . Hypothyroidism Mother   . COPD Mother   . Other Father        does not know biological father    Social History   Socioeconomic History  . Marital status: Divorced    Spouse name: Not on file  . Number of children: 1  . Years of education: Not on file  . Highest education level: Not on file  Occupational  History  . Occupation: International aid/development worker  Tobacco Use  . Smoking status: Current Every Day Smoker    Packs/day: 1.00    Years: 12.00    Pack years: 12.00  . Smokeless tobacco: Never Used  . Tobacco comment: stopped smoking with pregnancy, but restarted  Vaping Use  . Vaping Use: Never used  Substance and Sexual Activity  . Alcohol use: Yes    Alcohol/week: 12.0 standard drinks    Types: 12 Shots of liquor per week    Comment: 0-3 shots of liquor/night on 4 nights/week  . Drug use: No  . Sexual activity: Yes    Partners: Male    Birth control/protection: I.U.D.    Comment: Paragard  Other Topics Concern  . Not on file  Social History Narrative  . Not on file   Social Determinants of Health   Financial Resource Strain:   . Difficulty of Paying Living Expenses:   Food Insecurity:   . Worried About Programme researcher, broadcasting/film/video in the Last Year:   . Barista in the Last Year:   Transportation Needs:   . Freight forwarder (Medical):   Marland Kitchen Lack of Transportation (Non-Medical):   Physical Activity:   . Days of Exercise  per Week:   . Minutes of Exercise per Session:   Stress:   . Feeling of Stress :   Social Connections:   . Frequency of Communication with Friends and Family:   . Frequency of Social Gatherings with Friends and Family:   . Attends Religious Services:   . Active Member of Clubs or Organizations:   . Attends Banker Meetings:   Marland Kitchen Marital Status:   Intimate Partner Violence:   . Fear of Current or Ex-Partner:   . Emotionally Abused:   Marland Kitchen Physically Abused:   . Sexually Abused:     Outpatient Medications Prior to Visit  Medication Sig Dispense Refill  . metroNIDAZOLE (FLAGYL) 500 MG tablet Take by mouth.    . Multiple Vitamins-Iron (MULTIVITAMINS WITH IRON) TABS tablet Take 1 tablet by mouth daily. 65mg     . ondansetron (ZOFRAN-ODT) 4 MG disintegrating tablet Take 4 mg by mouth every 6 (six) hours.    . vitamin C (ASCORBIC ACID) 500 MG  tablet Take 1,000 mg by mouth daily.     . naproxen (NAPROSYN) 500 MG tablet Take by mouth. (Patient not taking: Reported on 09/15/2019)     Facility-Administered Medications Prior to Visit  Medication Dose Route Frequency Provider Last Rate Last Admin  . paragard intrauterine copper IUD   Intrauterine Once Jillisa Harris B, PA-C          ROS:  Review of Systems  Constitutional: Negative for fever.  Gastrointestinal: Positive for nausea and vomiting. Negative for blood in stool, constipation and diarrhea.  Genitourinary: Positive for dyspareunia, pelvic pain and vaginal bleeding. Negative for dysuria, flank pain, frequency, hematuria, urgency, vaginal discharge and vaginal pain.  Musculoskeletal: Negative for back pain.  Skin: Negative for rash.   BREAST: No symptoms   OBJECTIVE:   Vitals:  BP 110/80   Ht 5\' 1"  (1.549 m)   Wt 139 lb (63 kg)   LMP 09/03/2019 (Exact Date)   BMI 26.26 kg/m   Physical Exam Vitals reviewed.  Constitutional:      Appearance: She is well-developed.  Pulmonary:     Effort: Pulmonary effort is normal.  Genitourinary:    General: Normal vulva.     Pubic Area: No rash.      Labia:        Right: No rash, tenderness or lesion.        Left: No rash, tenderness or lesion.      Vagina: Bleeding present. No vaginal discharge, erythema or tenderness.     Cervix: Cervical motion tenderness present.     Uterus: Normal. Tender. Not enlarged.      Adnexa:        Right: Tenderness present. No mass.         Left: Tenderness present. No mass.       Comments: IUD STRINGS IN CX OS Musculoskeletal:        General: Normal range of motion.     Cervical back: Normal range of motion.  Skin:    General: Skin is warm and dry.  Neurological:     General: No focal deficit present.     Mental Status: She is alert and oriented to person, place, and time.  Psychiatric:        Mood and Affect: Mood normal.        Behavior: Behavior normal.        Thought  Content: Thought content normal.        Judgment: Judgment normal.  Results:  ULTRASOUND REPORT  Location: [facility]  Date of Service: 09/15/2019    Indications:Pelvic Pain with IUD   Findings:  The uterus is anteverted and measures 8.5 x 4.1 x 3.8 cm. Echo texture is homogenous without evidence of focal masses. The Endometrium measures 3.4 mm. The IUD is correctly placed within the uterus.   Right Ovary measures 3.0 x 2.9 x 1.9 cm. It is normal in appearance. Left Ovary measures 3.4 x 3.6 x 3.1 cm. There is a small simple cyst in the left ovary measuring 2.9 x 2.8 x 2.4 cm.  There is normal blood flow in both ovaries.  Survey of the adnexa demonstrates no adnexal masses. There is no free fluid in the cul de sac.  Impression: 1. The IUD is correctly placed within the uterus.  2. There is a small, simple cyst in the left ovary.  3. Normal right ovary.   Recommendations: 1.Clinical correlation with the patient's History and Physical Exam.  Deanna Artis, RT  Assessment/Plan:  Pelvic pain - Plan: doxycycline (VIBRAMYCIN) 100 MG capsule, cefTRIAXone (ROCEPHIN) injection 250 mg, Very tender on exam. Neg GYN u/s. Will treat for non-gon/chlam PID. Rocephin today, Rx doxy BID for 14 days. Pt already on flagyl BID for 7 days. NSAIDs/heating pad. RTO in 2 wks for f/u/sooner prn.  Encounter for routine checking of intrauterine contraceptive device (IUD)--IUD in place. Pt may want removed but will keep for now.  Left ovarian cyst--small simple cyst, not c/w pain sx and exam. No further screening needed.    Meds ordered this encounter  Medications  . doxycycline (VIBRAMYCIN) 100 MG capsule    Sig: Take 1 capsule (100 mg total) by mouth 2 (two) times daily.    Dispense:  28 capsule    Refill:  0    Order Specific Question:   Supervising Provider    Answer:   Nadara Mustard [id number]  . cefTRIAXone (ROCEPHIN) injection 250 mg      Return in about 2 weeks  (around 09/29/2019) for pelvic pain f/u.  Makyra Corprew B. Drayk Humbarger, PA-C 09/15/2019 10:39 AM

## 2019-09-28 NOTE — Progress Notes (Signed)
Patient, No Pcp Per   Chief Complaint  Patient presents with  . Gynecologic Exam    pelvis pain follow up    HPI:      Ms. Karen Carey is a 34 y.o. G1P1001 whose LMP was Patient's last menstrual period was 09/03/2019 (exact date)., presents today for pelvic pain f/u, treated empricially for PID 8/21 based on sx and neg GYN u/s. Pt doing much better. Almost done with abx. Feels back to normal. Irreg bleeding has resolved.  Pt with paragard and wants to keep it.    Past Medical History:  Diagnosis Date  . Mastalgia   . Tobacco use     Past Surgical History:  Procedure Laterality Date  . INTRAUTERINE DEVICE (IUD) INSERTION  2011  . IUD REMOVAL  11/2010  . Nexplanon  11/25/2010/ 09/26/2014   [facility]    Family History  Problem Relation Age of Onset  . Diabetes Mother        Type 2  . Hypertension Mother   . Hypothyroidism Mother   . COPD Mother   . Other Father        does not know biological father    Social History   Socioeconomic History  . Marital status: Divorced    Spouse name: Not on file  . Number of children: 1  . Years of education: Not on file  . Highest education level: Not on file  Occupational History  . Occupation: International aid/development worker  Tobacco Use  . Smoking status: Current Every Day Smoker    Packs/day: 1.00    Years: 12.00    Pack years: 12.00  . Smokeless tobacco: Never Used  . Tobacco comment: stopped smoking with pregnancy, but restarted  Vaping Use  . Vaping Use: Never used  Substance and Sexual Activity  . Alcohol use: Yes    Alcohol/week: 12.0 standard drinks    Types: 12 Shots of liquor per week    Comment: 0-3 shots of liquor/night on 4 nights/week  . Drug use: No  . Sexual activity: Yes    Partners: Male    Birth control/protection: I.U.D.    Comment: Paragard  Other Topics Concern  . Not on file  Social History Narrative  . Not on file   Social Determinants of Health   Financial Resource Strain:   . Difficulty  of Paying Living Expenses: Not on file  Food Insecurity:   . Worried About Programme researcher, broadcasting/film/video in the Last Year: Not on file  . Ran Out of Food in the Last Year: Not on file  Transportation Needs:   . Lack of Transportation (Medical): Not on file  . Lack of Transportation (Non-Medical): Not on file  Physical Activity:   . Days of Exercise per Week: Not on file  . Minutes of Exercise per Session: Not on file  Stress:   . Feeling of Stress : Not on file  Social Connections:   . Frequency of Communication with Friends and Family: Not on file  . Frequency of Social Gatherings with Friends and Family: Not on file  . Attends Religious Services: Not on file  . Active Member of Clubs or Organizations: Not on file  . Attends Banker Meetings: Not on file  . Marital Status: Not on file  Intimate Partner Violence:   . Fear of Current or Ex-Partner: Not on file  . Emotionally Abused: Not on file  . Physically Abused: Not on file  . Sexually Abused:  Not on file    Outpatient Medications Prior to Visit  Medication Sig Dispense Refill  . doxycycline (VIBRAMYCIN) 100 MG capsule Take 1 capsule (100 mg total) by mouth 2 (two) times daily. 28 capsule 0  . Multiple Vitamins-Iron (MULTIVITAMINS WITH IRON) TABS tablet Take 1 tablet by mouth daily. 65mg     . naproxen (NAPROSYN) 500 MG tablet Take by mouth. (Patient not taking: Reported on 09/15/2019)    . ondansetron (ZOFRAN-ODT) 4 MG disintegrating tablet Take 4 mg by mouth every 6 (six) hours.    . vitamin C (ASCORBIC ACID) 500 MG tablet Take 1,000 mg by mouth daily.      Facility-Administered Medications Prior to Visit  Medication Dose Route Frequency Provider Last Rate Last Admin  . paragard intrauterine copper IUD   Intrauterine Once Aundrey Elahi B, PA-C          ROS:  Review of Systems  Constitutional: Negative for fever.  Gastrointestinal: Negative for blood in stool, constipation, diarrhea, nausea and vomiting.   Genitourinary: Negative for dyspareunia, dysuria, flank pain, frequency, hematuria, urgency, vaginal bleeding, vaginal discharge and vaginal pain.  Musculoskeletal: Negative for back pain.  Skin: Negative for rash.   BREAST: No symptoms   OBJECTIVE:   Vitals:  BP 110/70   Pulse 89   Resp 16   Ht 5\' 1"  (1.549 m)   Wt 137 lb 12.8 oz (62.5 kg)   LMP 09/03/2019 (Exact Date)   SpO2 97%   BMI 26.04 kg/m   Physical Exam Vitals reviewed.  Constitutional:      Appearance: She is well-developed.  Pulmonary:     Effort: Pulmonary effort is normal.  Genitourinary:    General: Normal vulva.     Pubic Area: No rash.      Labia:        Right: No rash, tenderness or lesion.        Left: No rash, tenderness or lesion.      Vagina: Normal. No vaginal discharge, erythema or tenderness.     Cervix: Normal.     Uterus: Normal. Not enlarged and not tender.      Adnexa: Right adnexa normal and left adnexa normal.       Right: No mass or tenderness.         Left: No mass or tenderness.    Musculoskeletal:        General: Normal range of motion.     Cervical back: Normal range of motion.  Skin:    General: Skin is warm and dry.  Neurological:     General: No focal deficit present.     Mental Status: She is alert and oriented to person, place, and time.  Psychiatric:        Mood and Affect: Mood normal.        Behavior: Behavior normal.        Thought Content: Thought content normal.        Judgment: Judgment normal.      Assessment/Plan: Pelvic pain--resolved PID. Doing well. Neg exam. F/u prn.      Return if symptoms worsen or fail to improve.  Jashawn Floyd B. Markell Schrier, PA-C 09/29/2019 10:08 AM

## 2019-09-29 ENCOUNTER — Ambulatory Visit (INDEPENDENT_AMBULATORY_CARE_PROVIDER_SITE_OTHER): Payer: BC Managed Care – PPO | Admitting: Obstetrics and Gynecology

## 2019-09-29 ENCOUNTER — Encounter: Payer: Self-pay | Admitting: Obstetrics and Gynecology

## 2019-09-29 ENCOUNTER — Other Ambulatory Visit: Payer: Self-pay

## 2019-09-29 VITALS — BP 110/70 | HR 89 | Resp 16 | Ht 61.0 in | Wt 137.8 lb

## 2019-09-29 DIAGNOSIS — R102 Pelvic and perineal pain: Secondary | ICD-10-CM | POA: Diagnosis not present

## 2019-09-29 NOTE — Patient Instructions (Signed)
I value your feedback and entrusting us with your care. If you get a [facility] patient survey, I would appreciate you taking the time to let us know about your experience today. Thank you!  As of January 20, 2019, your lab results will be released to your MyChart immediately, before I even have a chance to see them. Please give me time to review them and contact you if there are any abnormalities. Thank you for your patience.  

## 2019-12-27 DIAGNOSIS — N9489 Other specified conditions associated with female genital organs and menstrual cycle: Secondary | ICD-10-CM | POA: Diagnosis not present

## 2019-12-27 DIAGNOSIS — R109 Unspecified abdominal pain: Secondary | ICD-10-CM | POA: Diagnosis not present

## 2019-12-27 DIAGNOSIS — R1033 Periumbilical pain: Secondary | ICD-10-CM | POA: Diagnosis not present

## 2019-12-27 DIAGNOSIS — R1031 Right lower quadrant pain: Secondary | ICD-10-CM | POA: Diagnosis not present

## 2020-05-29 IMAGING — MG DIGITAL DIAGNOSTIC BILATERAL MAMMOGRAM WITH TOMO AND CAD
2 series · 2 of 10 positions shown · non-contrast
Comparison: None.

CLINICAL DATA: 32-year-old female complaining of a palpable
abnormality in the right breast.

EXAM:
DIGITAL DIAGNOSTIC BILATERAL MAMMOGRAM WITH CAD AND TOMO
ULTRASOUND RIGHT BREAST

[R TAN tomo · tomo slice 38/75.0]
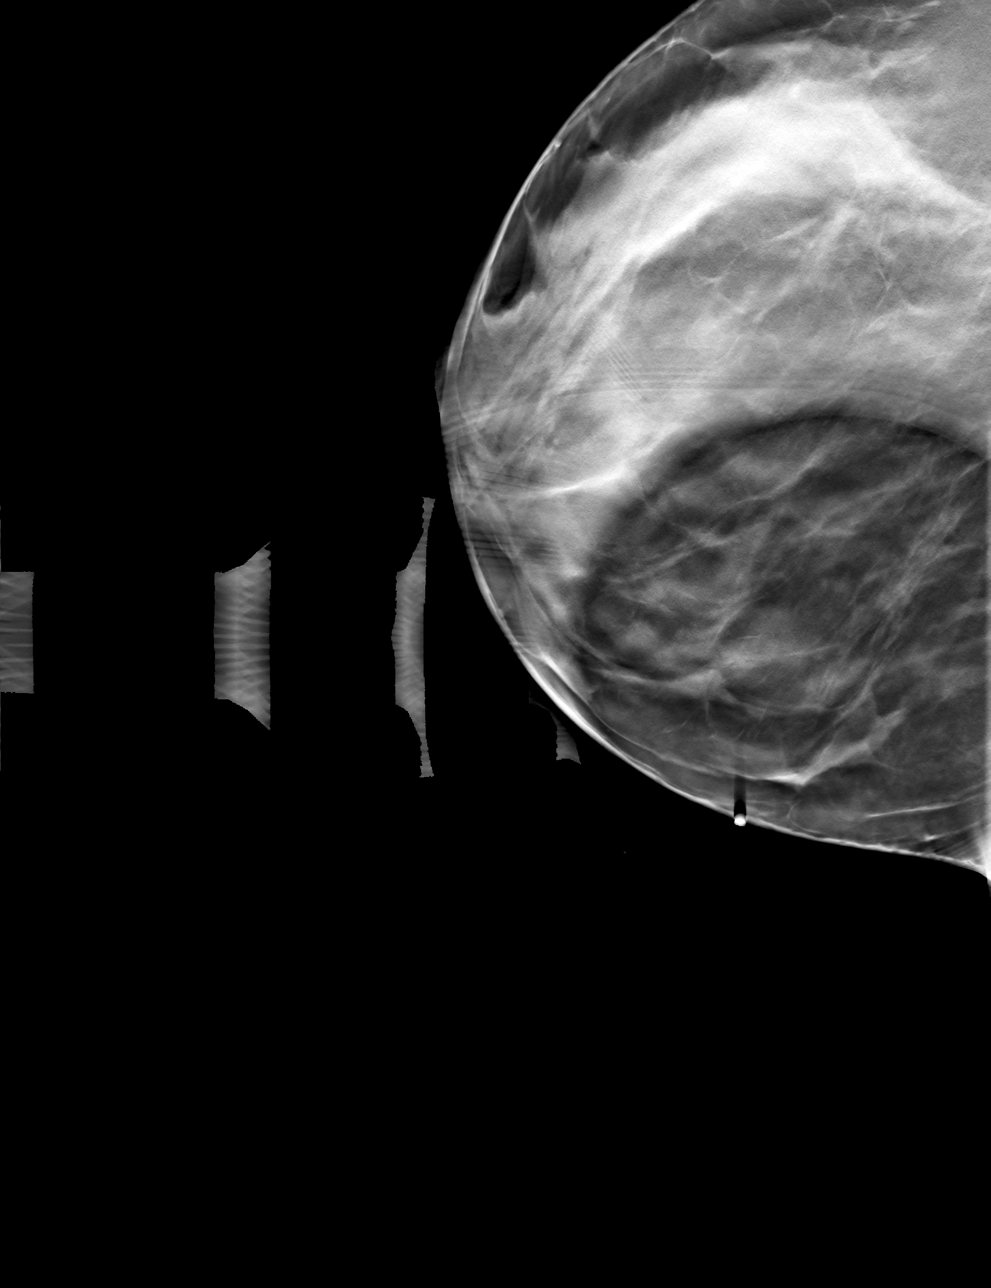

[R CC tomo · tomo slice 39/78.0]
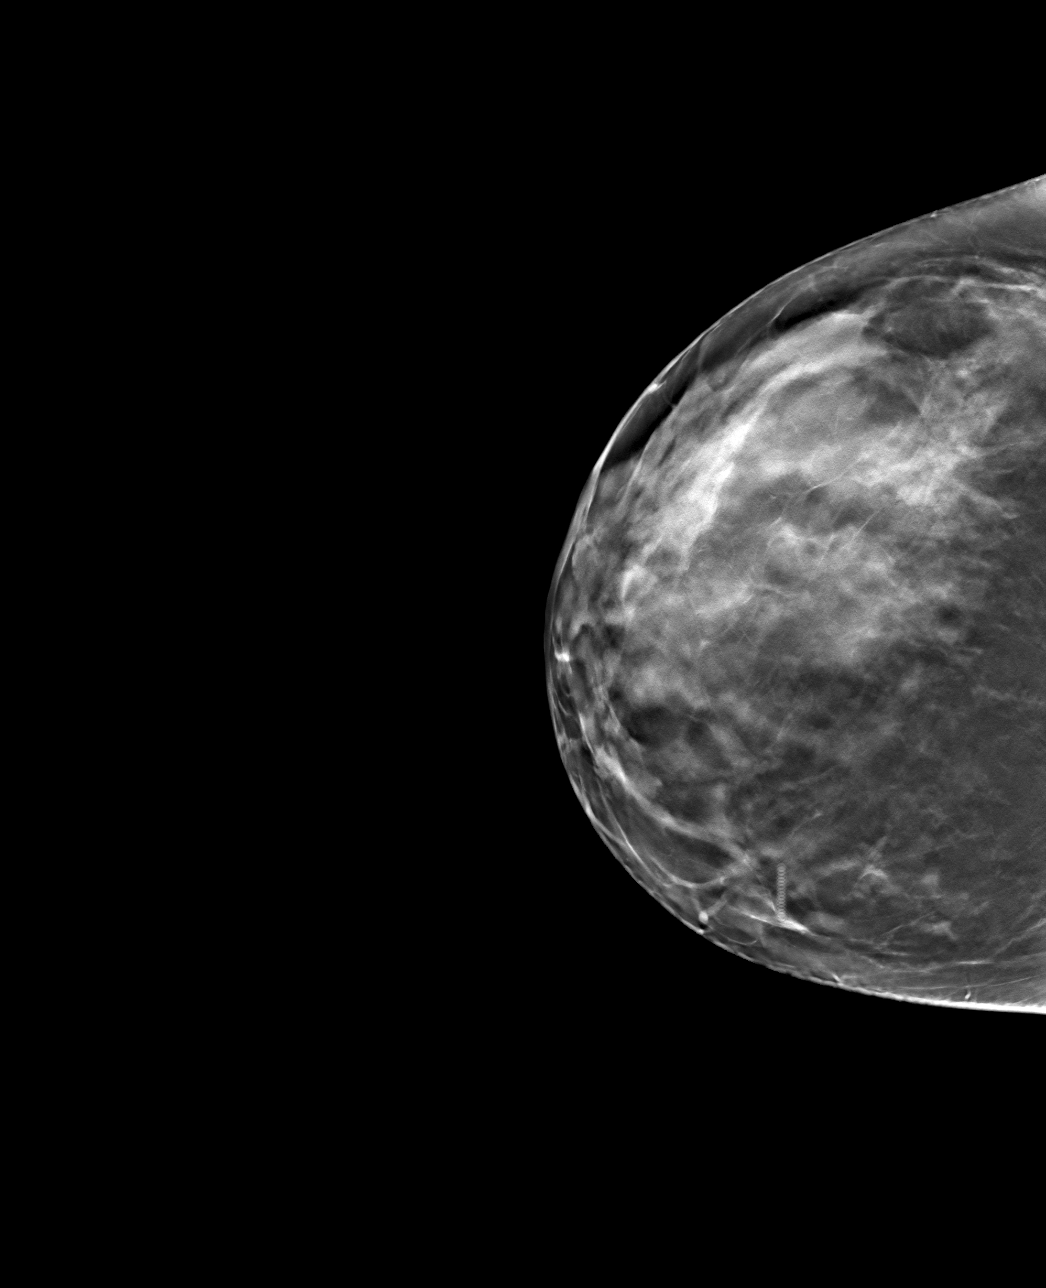

[2 of 10 positions shown; findings below may reference images not displayed]

ACR Breast Density Category c: The breast tissue is heterogeneously
dense, which may obscure small masses.
FINDINGS: No suspicious mass, malignant type microcalcifications or distortion
detected in either breast. Spot tangential view of the area of
clinical concern in the right breast shows normal fibroglandular
tissue.

Mammographic images were processed with CAD.

On physical exam, I do not palpate a mass in the lower-inner
quadrant of the right breast.

Targeted ultrasound is performed, showing normal tissue in the area
of clinical concern in the lower-inner quadrant of right breast. No
solid or cystic mass, abnormal shadowing or distortion visualized.
IMPRESSION: No evidence of malignancy in either breast.

RECOMMENDATION:
If the clinical exam remains benign/stable screening mammography can
be deferred until the age of 40.

I have discussed the findings and recommendations with the patient.
Results were also provided in writing at the conclusion of the
visit. If applicable, a reminder letter will be sent to the patient
regarding the next appointment.

BI-RADS CATEGORY  1: Negative.

## 2020-08-14 DIAGNOSIS — S92352A Displaced fracture of fifth metatarsal bone, left foot, initial encounter for closed fracture: Secondary | ICD-10-CM | POA: Diagnosis not present

## 2021-06-25 DIAGNOSIS — H6691 Otitis media, unspecified, right ear: Secondary | ICD-10-CM | POA: Diagnosis not present

## 2021-06-25 DIAGNOSIS — J029 Acute pharyngitis, unspecified: Secondary | ICD-10-CM | POA: Diagnosis not present

## 2021-07-24 ENCOUNTER — Ambulatory Visit (INDEPENDENT_AMBULATORY_CARE_PROVIDER_SITE_OTHER): Payer: BC Managed Care – PPO | Admitting: Physician Assistant

## 2021-07-24 ENCOUNTER — Encounter: Payer: Self-pay | Admitting: Physician Assistant

## 2021-07-24 VITALS — BP 124/90 | HR 72 | Ht 62.0 in | Wt 154.5 lb

## 2021-07-24 DIAGNOSIS — L811 Chloasma: Secondary | ICD-10-CM | POA: Diagnosis not present

## 2021-07-24 DIAGNOSIS — Z114 Encounter for screening for human immunodeficiency virus [HIV]: Secondary | ICD-10-CM | POA: Diagnosis not present

## 2021-07-24 DIAGNOSIS — F419 Anxiety disorder, unspecified: Secondary | ICD-10-CM

## 2021-07-24 DIAGNOSIS — Z7689 Persons encountering health services in other specified circumstances: Secondary | ICD-10-CM | POA: Diagnosis not present

## 2021-07-24 DIAGNOSIS — R609 Edema, unspecified: Secondary | ICD-10-CM

## 2021-07-24 DIAGNOSIS — R600 Localized edema: Secondary | ICD-10-CM | POA: Insufficient documentation

## 2021-07-24 DIAGNOSIS — F32A Depression, unspecified: Secondary | ICD-10-CM | POA: Diagnosis not present

## 2021-07-24 DIAGNOSIS — Z1159 Encounter for screening for other viral diseases: Secondary | ICD-10-CM | POA: Diagnosis not present

## 2021-07-24 NOTE — Assessment & Plan Note (Signed)
Discussed medication, therapy Ref to psychology, gave resources for online therapy. Will monitor

## 2021-07-24 NOTE — Assessment & Plan Note (Signed)
Advised SPF, could try vitamin C serum in AM and top w/ moisturizer w/ spf Retinol OTC, ie Differin gel or similar in evening, start with every third day, very small amount.

## 2021-07-24 NOTE — Progress Notes (Signed)
154.5lb   I,[name],acting as a Neurosurgeonscribe for Eastman KodakLindsay Rheba Diamond, PA-C.,have documented all relevant documentation on the behalf of Alfredia FergusonLindsay Aeris Hersman, PA-C,as directed by  Alfredia FergusonLindsay Laylana Gerwig, PA-C while in the presence of Alfredia FergusonLindsay Jendayi Berling, PA-C.  New patient visit   Patient: Karen Carey   DOB: 07/15/1985   36 y.o. Female  MRN: [id number] Visit Date: 07/24/2021  Today's healthcare provider: Alfredia FergusonLindsay Dorrie Cocuzza, PA-C   Cc. Establish care as a new patient, anxiety, leg swelling  Subjective    Karen Carey is a 36 y.o. female who presents today as a new patient to establish care.  HPI  History of anxiety/depression -Was started on Celexa at 3317, caused SI and she attempted; was committed for a few weeks. -Denies therapy/medication since then. -Over the last few months, increased fatigue, anxiety, irritability, losing temper. She quit smoking and started a new job at the beginning of the year which she thinks contributes.  Peripheral edema -2-3 times a week, end of day. Leaves a mark when she presses. Usually resolves in AM. Denies SOB, cough. Does admit to fatigue.  Skin discoloration -Forehead, other parts of face, darker hyperpigmented areas. Not raised, denies pruritus, rash.   Past Medical History:  Diagnosis Date   Anemia    Depression    Mastalgia    Tobacco use    Past Surgical History:  Procedure Laterality Date   INTRAUTERINE DEVICE (IUD) INSERTION  2011   IUD REMOVAL  11/2010   Nexplanon  11/25/2010/ 09/26/2014   [facility]   Family Status  Relation Name Status   Mother  Alive   Father  Alive   Family History  Problem Relation Age of Onset   Diabetes Mother        Type 2   Hypertension Mother    Hypothyroidism Mother    COPD Mother    Depression Mother    Other Father        does not know biological father   Social History   Socioeconomic History   Marital status: Divorced    Spouse name: Not on file   Number of children: 1   Years of education: Not on  file   Highest education level: Not on file  Occupational History   Occupation: International aid/development workerassistant manager  Tobacco Use   Smoking status: Former    Packs/day: 1.00    Years: 12.00    Total pack years: 12.00    Types: Cigarettes    Quit date: 03/13/2021    Years since quitting: 0.3   Smokeless tobacco: Never  Vaping Use   Vaping Use: Never used  Substance and Sexual Activity   Alcohol use: Yes    Alcohol/week: 12.0 standard drinks of alcohol    Types: 12 Shots of liquor per week    Comment: 0-3 shots of liquor/night on 4 nights/week   Drug use: No   Sexual activity: Yes    Partners: Male    Birth control/protection: None, Condom    Comment: IUD removed 12/22  Other Topics Concern   Not on file  Social History Narrative   Not on file   Social Determinants of Health   Financial Resource Strain: Not on file  Food Insecurity: Not on file  Transportation Needs: Not on file  Physical Activity: Inactive (06/12/2017)   Exercise Vital Sign    Days of Exercise per Week: 0 days    Minutes of Exercise per Session: 0 min  Stress: Not on file  Social Connections: Not on file  Outpatient Medications Prior to Visit  Medication Sig   [DISCONTINUED] doxycycline (VIBRAMYCIN) 100 MG capsule Take 1 capsule (100 mg total) by mouth 2 (two) times daily. (Patient not taking: Reported on 07/24/2021)   [DISCONTINUED] Multiple Vitamins-Iron (MULTIVITAMINS WITH IRON) TABS tablet Take 1 tablet by mouth daily. 65mg  (Patient not taking: Reported on 07/24/2021)   [DISCONTINUED] naproxen (NAPROSYN) 500 MG tablet Take by mouth. (Patient not taking: Reported on 09/15/2019)   [DISCONTINUED] ondansetron (ZOFRAN-ODT) 4 MG disintegrating tablet Take 4 mg by mouth every 6 (six) hours. (Patient not taking: Reported on 07/24/2021)   [DISCONTINUED] vitamin C (ASCORBIC ACID) 500 MG tablet Take 1,000 mg by mouth daily.  (Patient not taking: Reported on 07/24/2021)   Facility-Administered Medications Prior to Visit  Medication  Dose Route Frequency Provider   paragard intrauterine copper IUD   Intrauterine Once Copland, Alicia B, PA-C   No Known Allergies  Immunization History  Administered Date(s) Administered   DTaP 07/20/2014   Hepatitis B 07/20/2014   Moderna Sars-Covid-2 Vaccination 12/14/2020    Health Maintenance  Topic Date Due   Hepatitis C Screening  Never done   PAP SMEAR-Modifier  07/24/2021 (Originally 06/12/2020)   COVID-19 Vaccine (2 - Moderna series) 08/09/2021 (Originally 02/08/2021)   TETANUS/TDAP  07/25/2022 (Originally 12/19/2006)   INFLUENZA VACCINE  09/10/2021   HIV Screening  Completed   HPV VACCINES  Aged Out    Patient Care Team: 11/10/2021, PA-C as PCP - General (Physician Assistant) Copland, Alfredia Ferguson as Referring Physician (Obstetrics and Gynecology)  Review of Systems  Constitutional:  Positive for activity change, appetite change and fatigue.  Eyes:  Positive for photophobia and pain.  Respiratory:  Positive for shortness of breath.   Cardiovascular:  Positive for leg swelling.  Gastrointestinal:  Positive for abdominal distention and constipation.  Endocrine: Positive for cold intolerance and polyphagia.  Neurological:  Positive for headaches.  Hematological:  Bruises/bleeds easily.  Psychiatric/Behavioral:  Positive for agitation and decreased concentration. The patient is nervous/anxious.     Objective    Blood pressure (!) 126/91, pulse 72, height 5\' 2"  (1.575 m), weight 154 lb 8 oz (70.1 kg), SpO2 100 %.   Physical Exam Constitutional:      General: She is awake.     Appearance: She is well-developed.  HENT:     Head: Normocephalic.  Eyes:     Conjunctiva/sclera: Conjunctivae normal.  Cardiovascular:     Rate and Rhythm: Normal rate and regular rhythm.     Heart sounds: Normal heart sounds.  Pulmonary:     Effort: Pulmonary effort is normal.     Breath sounds: Normal breath sounds.  Musculoskeletal:     Right lower leg: No edema.     Left  lower leg: No edema.  Skin:    General: Skin is warm.  Neurological:     Mental Status: She is alert and oriented to person, place, and time.  Psychiatric:        Attention and Perception: Attention normal.        Mood and Affect: Mood normal.        Speech: Speech normal.        Behavior: Behavior is cooperative.    Depression Screen    07/24/2021   10:44 AM  PHQ 2/9 Scores  PHQ - 2 Score 3  PHQ- 9 Score 18   No results found for any visits on 07/24/21.  Assessment & Plan      Problem List Items Addressed  This Visit       Musculoskeletal and Integument   Melasma    Advised SPF, could try vitamin C serum in AM and top w/ moisturizer w/ spf Retinol OTC, ie Differin gel or similar in evening, start with every third day, very small amount.         Other   Anxiety and depression    Discussed medication, therapy Ref to psychology, gave resources for online therapy. Will monitor      Relevant Orders   Ambulatory referral to Psychology   TSH + free T4   Peripheral edema    Advised pt to wear compression socks, elevated feet. If still occurring despite compression socks, will consider echo d/t unusual presentation  -cmp,cbc,tsh/t4      Relevant Orders   Comprehensive Metabolic Panel (CMET)   CBC w/Diff/Platelet   TSH + free T4   Other Visit Diagnoses     Encounter to establish care    -  Primary   Relevant Orders   Comprehensive Metabolic Panel (CMET)   CBC w/Diff/Platelet   HgB A1c   Encounter for hepatitis C screening test for low risk patient       Relevant Orders   Hepatitis C antibody   Encounter for screening for HIV       Relevant Orders   HIV antibody (with reflex)       Return in about 6 months (around 01/23/2022), or if symptoms worsen or fail to improve.     I, Alfredia Ferguson, PA-C have reviewed all documentation for this visit. The documentation on  07/24/2021 for the exam, diagnosis, procedures, and orders are all accurate and  complete.  Alfredia Ferguson, PA-C [facility] [address] [place], [place], [address] Office: [phone] Fax: [phone]   [facility]

## 2021-07-24 NOTE — Patient Instructions (Addendum)
[facility]  [email or web address] Can call main number or [facility] number and they will walk you through it. Usually virtual appointments  Vitamin C serum in the morning, and then top with moisturizer/ spf, at least spf 30 Retinol in the evening sparingly, many OTC, differin gel is one. Start with every 3rd day to get used to it.

## 2021-07-24 NOTE — Assessment & Plan Note (Signed)
Advised pt to wear compression socks, elevated feet. If still occurring despite compression socks, will consider echo d/t unusual presentation  -cmp,cbc,tsh/t4

## 2021-07-25 ENCOUNTER — Telehealth: Payer: Self-pay

## 2021-07-25 ENCOUNTER — Other Ambulatory Visit: Payer: Self-pay | Admitting: Physician Assistant

## 2021-07-25 DIAGNOSIS — E039 Hypothyroidism, unspecified: Secondary | ICD-10-CM

## 2021-07-25 LAB — CBC WITH DIFFERENTIAL/PLATELET
Basophils Absolute: 0.1 10*3/uL (ref 0.0–0.2)
Basos: 1 %
EOS (ABSOLUTE): 0.3 10*3/uL (ref 0.0–0.4)
Eos: 4 %
Hematocrit: 39.3 % (ref 34.0–46.6)
Hemoglobin: 13.1 g/dL (ref 11.1–15.9)
Immature Grans (Abs): 0 10*3/uL (ref 0.0–0.1)
Immature Granulocytes: 0 %
Lymphocytes Absolute: 2.8 10*3/uL (ref 0.7–3.1)
Lymphs: 40 %
MCH: 28.6 pg (ref 26.6–33.0)
MCHC: 33.3 g/dL (ref 31.5–35.7)
MCV: 86 fL (ref 79–97)
Monocytes Absolute: 0.4 10*3/uL (ref 0.1–0.9)
Monocytes: 6 %
Neutrophils Absolute: 3.4 10*3/uL (ref 1.4–7.0)
Neutrophils: 49 %
Platelets: 239 10*3/uL (ref 150–450)
RBC: 4.58 x10E6/uL (ref 3.77–5.28)
RDW: 12.8 % (ref 11.7–15.4)
WBC: 7 10*3/uL (ref 3.4–10.8)

## 2021-07-25 LAB — COMPREHENSIVE METABOLIC PANEL WITH GFR
ALT: 12 IU/L (ref 0–32)
AST: 17 IU/L (ref 0–40)
Albumin/Globulin Ratio: 1.7 (ref 1.2–2.2)
Albumin: 4.6 g/dL (ref 3.8–4.8)
Alkaline Phosphatase: 31 IU/L — ABNORMAL LOW (ref 44–121)
BUN/Creatinine Ratio: 10 (ref 9–23)
BUN: 10 mg/dL (ref 6–20)
Bilirubin Total: 0.3 mg/dL (ref 0.0–1.2)
CO2: 22 mmol/L (ref 20–29)
Calcium: 9.3 mg/dL (ref 8.7–10.2)
Chloride: 105 mmol/L (ref 96–106)
Creatinine, Ser: 1.04 mg/dL — ABNORMAL HIGH (ref 0.57–1.00)
Globulin, Total: 2.7 g/dL (ref 1.5–4.5)
Glucose: 79 mg/dL (ref 70–99)
Potassium: 4.3 mmol/L (ref 3.5–5.2)
Sodium: 141 mmol/L (ref 134–144)
Total Protein: 7.3 g/dL (ref 6.0–8.5)
eGFR: 73 mL/min/1.73

## 2021-07-25 LAB — HEPATITIS C ANTIBODY: Hep C Virus Ab: NONREACTIVE

## 2021-07-25 LAB — HEMOGLOBIN A1C
Est. average glucose Bld gHb Est-mCnc: 103 mg/dL
Hgb A1c MFr Bld: 5.2 % (ref 4.8–5.6)

## 2021-07-25 LAB — TSH+FREE T4
Free T4: 0.16 ng/dL — ABNORMAL LOW (ref 0.82–1.77)
TSH: 198 u[IU]/mL — ABNORMAL HIGH (ref 0.450–4.500)

## 2021-07-25 LAB — HIV ANTIBODY (ROUTINE TESTING W REFLEX): HIV Screen 4th Generation wRfx: NONREACTIVE

## 2021-07-25 MED ORDER — LEVOTHYROXINE SODIUM 75 MCG PO TABS: 75.0000 ug | ORAL_TABLET | Freq: Every day | ORAL | 3 refills | Status: DC

## 2021-07-25 NOTE — Telephone Encounter (Signed)
Copied from CRM [id number]. Topic: General - Other >> Jul 25, 2021 10:54 AM Macon Large wrote: Reason for CRM: Pt requests call back to go over lab results. Cb# [phone]

## 2021-08-08 ENCOUNTER — Ambulatory Visit (HOSPITAL_COMMUNITY): Payer: Self-pay | Admitting: Licensed Clinical Social Worker

## 2021-08-12 ENCOUNTER — Ambulatory Visit: Payer: Self-pay

## 2021-08-12 NOTE — Telephone Encounter (Signed)
    Chief Complaint: Pt. States after she started her Synthroid, she started having a very heavy period. Thinks this is a side effect. Has a OB/GYN appointment 09/06/21. Should she talk to [facility] about this?  Symptoms: Above Frequency: 1-2 weeks ago Pertinent Negatives: Patient denies  Disposition: [] ED /[] Urgent Care (no appt availability in office) / [] Appointment(In office/virtual)/ []  [facility]/ [] Home Care/ [] Refused Recommended Disposition /[] [facility]/ [x]  Follow-up with PCP Additional Notes: Please advise pt.  Answer Assessment - Initial Assessment Questions 1. NAME of MEDICATION: "What medicine are you calling about?"     Synthroid 2. QUESTION: "What is your question?" (e.g., double dose of medicine, side effect)     Will this cause heavy menstrual flow? 3. PRESCRIBING HCP: "Who prescribed it?" Reason: if prescribed by specialist, call should be referred to that group.     [facility] 4. SYMPTOMS: "Do you have any symptoms?"     Heavy flow 5. SEVERITY: If symptoms are present, ask "Are they mild, moderate or severe?"     Moderate 6. PREGNANCY:  "Is there any chance that you are pregnant?" "When was your last menstrual period?"     No  Protocols used: Medication Question Call-A-AH

## 2021-09-05 ENCOUNTER — Encounter: Payer: Self-pay | Admitting: Physician Assistant

## 2021-09-05 ENCOUNTER — Ambulatory Visit (INDEPENDENT_AMBULATORY_CARE_PROVIDER_SITE_OTHER): Payer: BC Managed Care – PPO | Admitting: Physician Assistant

## 2021-09-05 VITALS — BP 113/89 | HR 89 | Ht 62.0 in | Wt 151.9 lb

## 2021-09-05 DIAGNOSIS — R609 Edema, unspecified: Secondary | ICD-10-CM

## 2021-09-05 DIAGNOSIS — E039 Hypothyroidism, unspecified: Secondary | ICD-10-CM

## 2021-09-05 NOTE — Assessment & Plan Note (Addendum)
Not really improved with thyroid meds or compression socks Not present today but pt brings pictures Will order echo for unusual presentation if wnl consider vascular ref

## 2021-09-05 NOTE — Progress Notes (Signed)
PCP:  Alfredia Ferguson, PA-C   Chief Complaint  Patient presents with   Gynecologic Exam    No concerns     HPI:      Ms. Karen Carey is a 36 y.o. G1P1001 whose LMP was Patient's last menstrual period was 08/05/2021 (approximate)., presents today for her annual examination.  Her menses are regular every 28-30 days, lasting 4-5 days without IUD.  Dysmenorrhea mod to severe, can't afford to miss activities/work; NSAIDs not helpful. No BTB. Last couple of periods heavier and longer due to uncontrolled hypothyroidism; followed by PCP and getting med adjustments currently.   Sex activity: single partner, contraception - none, conception ok, not taking PNVs. Partner with low testosterone. Paragard placed 12/20 and removed 12/22 Last Pap: 06/12/17  Results were: no abnormalities   There is no FH of breast cancer. There is no FH of ovarian cancer. The patient does not do self-breast exams.  Tobacco use: quit smoking 5 months ago Alcohol use: social drinker No drug use.  Exercise: moderately active  She does not get adequate calcium and Vitamin D in her diet.  Patient Active Problem List   Diagnosis Date Noted   Hypothyroidism 09/05/2021   Anxiety and depression 07/24/2021   Peripheral edema 07/24/2021   Melasma 07/24/2021   Left ovarian cyst 09/15/2019   Pelvic pain 09/15/2019   Mastalgia    Tobacco use    Tobacco dependence due to cigarettes 06/12/2017    Past Surgical History:  Procedure Laterality Date   INTRAUTERINE DEVICE (IUD) INSERTION  2011   IUD REMOVAL  11/2010   Nexplanon  11/25/2010/ 09/26/2014   [facility]    Family History  Problem Relation Age of Onset   Diabetes Mother        Type 2   Hypertension Mother    Hypothyroidism Mother    COPD Mother    Depression Mother    Other Father        does not know biological father    Social History   Socioeconomic History   Marital status: Divorced    Spouse name: Not on file   Number of children: 1    Years of education: Not on file   Highest education level: Not on file  Occupational History   Occupation: International aid/development worker  Tobacco Use   Smoking status: Former    Packs/day: 1.00    Years: 12.00    Total pack years: 12.00    Types: Cigarettes    Quit date: 03/13/2021    Years since quitting: 0.4   Smokeless tobacco: Never  Vaping Use   Vaping Use: Never used  Substance and Sexual Activity   Alcohol use: Yes    Alcohol/week: 12.0 standard drinks of alcohol    Types: 12 Shots of liquor per week    Comment: 0-3 shots of liquor/night on 4 nights/week   Drug use: No   Sexual activity: Yes    Partners: Male    Birth control/protection: None  Other Topics Concern   Not on file  Social History Narrative   Not on file   Social Determinants of Health   Financial Resource Strain: Not on file  Food Insecurity: Not on file  Transportation Needs: Not on file  Physical Activity: Inactive (06/12/2017)   Exercise Vital Sign    Days of Exercise per Week: 0 days    Minutes of Exercise per Session: 0 min  Stress: Not on file  Social Connections: Not on file  Intimate  Partner Violence: Not on file     Current Outpatient Medications:    levothyroxine (SYNTHROID) 88 MCG tablet, Take 1 tablet (88 mcg total) by mouth daily., Disp: 90 tablet, Rfl: 1  Current Facility-Administered Medications:    paragard intrauterine copper IUD, , Intrauterine, Once, Alonia Dibuono B, PA-C     ROS:  Review of Systems  Constitutional:  Negative for fatigue, fever and unexpected weight change.  Respiratory:  Negative for cough, shortness of breath and wheezing.   Cardiovascular:  Negative for chest pain, palpitations and leg swelling.  Gastrointestinal:  Negative for blood in stool, constipation, diarrhea, nausea and vomiting.  Endocrine: Negative for cold intolerance, heat intolerance and polyuria.  Genitourinary:  Positive for menstrual problem. Negative for dyspareunia, dysuria, flank pain,  frequency, genital sores, hematuria, pelvic pain, urgency, vaginal bleeding, vaginal discharge and vaginal pain.  Musculoskeletal:  Negative for back pain, joint swelling and myalgias.  Skin:  Negative for rash.  Neurological:  Negative for dizziness, syncope, light-headedness, numbness and headaches.  Hematological:  Negative for adenopathy.  Psychiatric/Behavioral:  Negative for agitation, confusion, sleep disturbance and suicidal ideas. The patient is not nervous/anxious.    BREAST: No symptoms   Objective: BP 102/70   Ht 5\' 1"  (1.549 m)   Wt 152 lb (68.9 kg)   LMP 08/05/2021 (Approximate)   BMI 28.72 kg/m    Physical Exam Constitutional:      Appearance: She is well-developed.  Genitourinary:     Vulva normal.     Right Labia: No rash, tenderness or lesions.    Left Labia: No tenderness, lesions or rash.    No vaginal discharge, erythema or tenderness.      Right Adnexa: not tender and no mass present.    Left Adnexa: not tender and no mass present.    No cervical friability or polyp.     Uterus is not enlarged or tender.  Breasts:    Right: No mass, nipple discharge, skin change or tenderness.     Left: No mass, nipple discharge, skin change or tenderness.  Neck:     Thyroid: No thyromegaly.  Cardiovascular:     Rate and Rhythm: Normal rate and regular rhythm.     Heart sounds: Normal heart sounds. No murmur heard. Pulmonary:     Effort: Pulmonary effort is normal.     Breath sounds: Normal breath sounds.  Abdominal:     Palpations: Abdomen is soft.     Tenderness: There is no abdominal tenderness. There is no guarding or rebound.  Musculoskeletal:        General: Normal range of motion.     Cervical back: Normal range of motion.  Lymphadenopathy:     Cervical: No cervical adenopathy.  Neurological:     General: No focal deficit present.     Mental Status: She is alert and oriented to person, place, and time.     Cranial Nerves: No cranial nerve deficit.   Skin:    General: Skin is warm and dry.  Psychiatric:        Mood and Affect: Mood normal.        Behavior: Behavior normal.        Thought Content: Thought content normal.        Judgment: Judgment normal.  Vitals reviewed.     Assessment/Plan: Encounter for annual routine gynecological examination  Cervical cancer screening - Plan: Cytology - PAP  Screening for HPV (human papillomavirus) - Plan: Cytology - PAP  Pre-conception counseling--start  PNVs, f/u prn NOB.  GYN counsel adequate intake of calcium and vitamin D, diet and exercise     F/U  Return in about 1 year (around 09/07/2022).  Collin Hendley B. Vaughan Garfinkle, PA-C 09/06/2021 1:58 PM

## 2021-09-05 NOTE — Progress Notes (Signed)
I,Karen Carey,acting as a Neurosurgeon for [employer], PA-C.,have documented all relevant documentation on the behalf of Karen Ferguson, PA-C,as directed by  Karen Ferguson, PA-C while in the presence of Karen Ferguson, PA-C.   Established patient visit   Patient: Karen Carey   DOB: Oct 07, 1985   36 y.o. Female  MRN: [id number] Visit Date: 09/05/2021  Today's healthcare provider: Alfredia Ferguson, PA-C   Cc. Hypothyroid f/u  Subjective    HPI   Hypothyroid, follow-up  Lab Results  Component Value Date   TSH 198.000 (H) 07/24/2021   FREET4 0.16 (L) 07/24/2021    Wt Readings from Last 3 Encounters:  09/05/21 151 lb 14.4 oz (68.9 kg)  07/24/21 154 lb 8 oz (70.1 kg)  09/29/19 137 lb 12.8 oz (62.5 kg)    She was last seen for hypothyroid 6 weeks ago.  Management since that visit includes start levothyroxine 75 mg. She reports excellent compliance with treatment. She is not having side effects.   Symptoms: Yes change in energy level (has more) No constipation  No diarrhea No heat / cold intolerance  No nervousness No palpitations  No weight changes    -----------------------------------------------------------------------------------------   Reports continued pitting peripheral edema. Used to be 1-2 times a week , last week was nearly daily. She is wearing compression socks, getting up frequently at work, keeping her feet up. Reports it typically happens at the end of the day, resolves in the morning. One of the worse days last week, also felt abdominal bloating. Denies SOB, new cough, chest pain.  Medications: Outpatient Medications Prior to Visit  Medication Sig   levothyroxine (SYNTHROID) 75 MCG tablet Take 1 tablet (75 mcg total) by mouth daily.   Facility-Administered Medications Prior to Visit  Medication Dose Route Frequency Provider   paragard intrauterine copper IUD   Intrauterine Once Copland, Alicia B, PA-C    Review of Systems  Constitutional:   Negative for fatigue and fever.  Respiratory:  Negative for cough and shortness of breath.   Cardiovascular:  Positive for leg swelling. Negative for chest pain.  Gastrointestinal:  Negative for abdominal pain.  Neurological:  Negative for dizziness and headaches.       Objective    Blood pressure 113/89, pulse 89, height 5\' 2"  (1.575 m), weight 151 lb 14.4 oz (68.9 kg), SpO2 100 %.   Physical Exam Constitutional:      General: She is awake.     Appearance: She is well-developed.  HENT:     Head: Normocephalic.  Eyes:     Conjunctiva/sclera: Conjunctivae normal.  Cardiovascular:     Rate and Rhythm: Normal rate and regular rhythm.     Heart sounds: Normal heart sounds.  Pulmonary:     Effort: Pulmonary effort is normal.     Breath sounds: Normal breath sounds.  Musculoskeletal:     Right lower leg: No edema.     Left lower leg: No edema.  Skin:    General: Skin is warm.  Neurological:     Mental Status: She is alert and oriented to person, place, and time.  Psychiatric:        Attention and Perception: Attention normal.        Mood and Affect: Mood normal.        Speech: Speech normal.        Behavior: Behavior is cooperative.      No results found for any visits on 09/05/21.  Assessment & Plan  Problem List Items Addressed This Visit       Endocrine   Hypothyroidism - Primary    Pt feeling much better on meds;  Will check tsh/t4 and TPO ab. Educated of etiology of hypothyroidism      Relevant Orders   TSH + free T4   Thyroid Peroxidase Antibodies (TPO) (REFL)     Other   Peripheral edema    Not really improved with thyroid meds or compression socks Not present today but pt brings pictures Will order echo for unusual presentation if wnl consider vascular ref      Relevant Orders   Comprehensive Metabolic Panel (CMET)   B Nat Peptide   ECHOCARDIOGRAM COMPLETE     Return in about 6 months (around 03/08/2022) for hypothyroid; will contact w/ lab  results.      I, Karen Ferguson, PA-C have reviewed all documentation for this visit. The documentation on 09/05/2021 for the exam, diagnosis, procedures, and orders are all accurate and complete.  Karen Ferguson, PA-C [facility] [address] [place], [place], [address] Office: [phone] Fax: [phone]   [facility]

## 2021-09-05 NOTE — Assessment & Plan Note (Signed)
Pt feeling much better on meds;  Will check tsh/t4 and TPO ab. Educated of etiology of hypothyroidism

## 2021-09-06 ENCOUNTER — Encounter: Payer: Self-pay | Admitting: Obstetrics and Gynecology

## 2021-09-06 ENCOUNTER — Other Ambulatory Visit: Payer: Self-pay

## 2021-09-06 ENCOUNTER — Ambulatory Visit (INDEPENDENT_AMBULATORY_CARE_PROVIDER_SITE_OTHER): Payer: BC Managed Care – PPO | Admitting: Obstetrics and Gynecology

## 2021-09-06 ENCOUNTER — Encounter: Payer: Self-pay | Admitting: Physician Assistant

## 2021-09-06 ENCOUNTER — Other Ambulatory Visit (HOSPITAL_COMMUNITY)
Admission: RE | Admit: 2021-09-06 | Discharge: 2021-09-06 | Disposition: A | Discharge status: Home / Self Care | Payer: BC Managed Care – PPO | Source: Ambulatory Visit | Attending: Obstetrics and Gynecology | Admitting: Obstetrics and Gynecology

## 2021-09-06 ENCOUNTER — Other Ambulatory Visit: Payer: Self-pay | Admitting: Physician Assistant

## 2021-09-06 VITALS — BP 102/70 | Ht 61.0 in | Wt 152.0 lb

## 2021-09-06 DIAGNOSIS — Z30431 Encounter for routine checking of intrauterine contraceptive device: Secondary | ICD-10-CM

## 2021-09-06 DIAGNOSIS — Z1151 Encounter for screening for human papillomavirus (HPV): Secondary | ICD-10-CM

## 2021-09-06 DIAGNOSIS — Z3169 Encounter for other general counseling and advice on procreation: Secondary | ICD-10-CM | POA: Diagnosis not present

## 2021-09-06 DIAGNOSIS — E039 Hypothyroidism, unspecified: Secondary | ICD-10-CM

## 2021-09-06 DIAGNOSIS — Z01419 Encounter for gynecological examination (general) (routine) without abnormal findings: Secondary | ICD-10-CM | POA: Diagnosis not present

## 2021-09-06 DIAGNOSIS — Z124 Encounter for screening for malignant neoplasm of cervix: Secondary | ICD-10-CM | POA: Diagnosis not present

## 2021-09-06 MED ORDER — LEVOTHYROXINE SODIUM 88 MCG PO TABS: 88.0000 ug | ORAL_TABLET | Freq: Every day | ORAL | 1 refills | Status: DC

## 2021-09-06 NOTE — Patient Instructions (Signed)
I value your feedback and you entrusting us with your care. If you get a [facility] patient survey, I would appreciate you taking the time to let us know about your experience today. Thank you! ? ? ?

## 2021-09-07 LAB — COMPREHENSIVE METABOLIC PANEL
ALT: 8 IU/L (ref 0–32)
AST: 15 IU/L (ref 0–40)
Albumin/Globulin Ratio: 1.7 (ref 1.2–2.2)
Albumin: 4.5 g/dL (ref 3.9–4.9)
Alkaline Phosphatase: 34 IU/L — ABNORMAL LOW (ref 44–121)
BUN/Creatinine Ratio: 10 (ref 9–23)
BUN: 7 mg/dL (ref 6–20)
Bilirubin Total: 0.4 mg/dL (ref 0.0–1.2)
CO2: 22 mmol/L (ref 20–29)
Calcium: 9.5 mg/dL (ref 8.7–10.2)
Chloride: 101 mmol/L (ref 96–106)
Creatinine, Ser: 0.71 mg/dL (ref 0.57–1.00)
Globulin, Total: 2.6 g/dL (ref 1.5–4.5)
Glucose: 82 mg/dL (ref 70–99)
Potassium: 4.3 mmol/L (ref 3.5–5.2)
Sodium: 136 mmol/L (ref 134–144)
Total Protein: 7.1 g/dL (ref 6.0–8.5)
eGFR: 115 mL/min/{1.73_m2} (ref >59)

## 2021-09-07 LAB — THYROID PEROXIDASE ANTIBODY: Thyroperoxidase Ab SerPl-aCnc: 289 IU/mL — ABNORMAL HIGH (ref 0–34)

## 2021-09-07 LAB — TSH+FREE T4
Free T4: 1.35 ng/dL (ref 0.82–1.77)
TSH: 12.3 u[IU]/mL — ABNORMAL HIGH (ref 0.450–4.500)

## 2021-09-07 LAB — BRAIN NATRIURETIC PEPTIDE: BNP: 16.1 pg/mL (ref 0.0–100.0)

## 2021-09-12 ENCOUNTER — Ambulatory Visit (HOSPITAL_COMMUNITY): Payer: Self-pay | Admitting: Licensed Clinical Social Worker

## 2021-09-16 ENCOUNTER — Encounter: Payer: Self-pay | Admitting: Obstetrics and Gynecology

## 2021-09-16 LAB — CYTOLOGY - PAP
Comment: NEGATIVE
Diagnosis: NEGATIVE
High risk HPV: POSITIVE — AB

## 2021-10-18 ENCOUNTER — Ambulatory Visit: Payer: BC Managed Care – PPO | Admitting: Physician Assistant

## 2021-10-18 ENCOUNTER — Telehealth: Payer: Self-pay | Admitting: *Deleted

## 2021-10-18 NOTE — Progress Notes (Deleted)
      Established patient visit   Patient: Karen Carey   DOB: 01/23/1986   36 y.o. Female  MRN: [id number] Visit Date: 10/18/2021  Today's healthcare provider: Alfredia Ferguson, PA-C   No chief complaint on file.  Subjective    HPI  Hypothyroid, follow-up  Lab Results  Component Value Date   TSH 12.300 (H) 09/05/2021   TSH 198.000 (H) 07/24/2021   FREET4 1.35 09/05/2021   FREET4 0.16 (L) 07/24/2021    Wt Readings from Last 3 Encounters:  09/06/21 152 lb (68.9 kg)  09/05/21 151 lb 14.4 oz (68.9 kg)  07/24/21 154 lb 8 oz (70.1 kg)    She was last seen for hypothyroid 2 months ago.  Management since that visit includes; increased levothyroxine to 88 mcg.  -----------------------------------------------------------------------------------------   Medications: Outpatient Medications Prior to Visit  Medication Sig   levothyroxine (SYNTHROID) 88 MCG tablet Take 1 tablet (88 mcg total) by mouth daily.   Facility-Administered Medications Prior to Visit  Medication Dose Route Frequency Provider   paragard intrauterine copper IUD   Intrauterine Once Copland, Alicia B, PA-C    Review of Systems  {Labs  Heme  Chem  Endocrine  Serology  Results Review (optional):23779}   Objective    There were no vitals taken for this visit. {Show previous vital signs (optional):23777}  Physical Exam  ***  No results found for any visits on 10/18/21.  Assessment & Plan     ***  No follow-ups on file.      {provider attestation***:1}   Alfredia Ferguson, PA-C  [facility] [phone] (phone) [phone] (fax)  [facility]

## 2021-10-18 NOTE — Telephone Encounter (Signed)
Please advise 

## 2021-10-18 NOTE — Telephone Encounter (Signed)
Copied from CRM [id number]. Topic: Quick Conservator, museum/gallery Patient (Clinic Use ONLY) >> Oct 17, 2021  4:50 PM [name] wrote: Reason for CRM: Patient scheduled to come in tomorrow for labwork. Please advise patient appt is not needed to do lab work and appt can be cancelled unless she is having concerns she would like to discuss with provider

## 2021-10-24 DIAGNOSIS — E039 Hypothyroidism, unspecified: Secondary | ICD-10-CM | POA: Diagnosis not present

## 2021-10-25 LAB — TSH: TSH: 2.01 u[IU]/mL (ref 0.450–4.500)

## 2021-10-25 LAB — T4: T4, Total: 10.8 ug/dL (ref 4.5–12.0)

## 2021-11-29 ENCOUNTER — Other Ambulatory Visit: Payer: Self-pay | Admitting: Physician Assistant

## 2021-11-29 DIAGNOSIS — E039 Hypothyroidism, unspecified: Secondary | ICD-10-CM

## 2022-01-27 ENCOUNTER — Encounter: Payer: Self-pay | Admitting: Physician Assistant

## 2022-01-27 ENCOUNTER — Ambulatory Visit (INDEPENDENT_AMBULATORY_CARE_PROVIDER_SITE_OTHER): Payer: BC Managed Care – PPO | Admitting: Physician Assistant

## 2022-01-27 ENCOUNTER — Ambulatory Visit: Payer: BC Managed Care – PPO | Attending: Physician Assistant

## 2022-01-27 VITALS — BP 120/83 | HR 100 | Temp 97.9°F | Ht 62.0 in | Wt 161.4 lb

## 2022-01-27 DIAGNOSIS — N644 Mastodynia: Secondary | ICD-10-CM

## 2022-01-27 DIAGNOSIS — R002 Palpitations: Secondary | ICD-10-CM | POA: Diagnosis not present

## 2022-01-27 DIAGNOSIS — Z0000 Encounter for general adult medical examination without abnormal findings: Secondary | ICD-10-CM

## 2022-01-27 DIAGNOSIS — Z720 Tobacco use: Secondary | ICD-10-CM | POA: Diagnosis not present

## 2022-01-27 DIAGNOSIS — K219 Gastro-esophageal reflux disease without esophagitis: Secondary | ICD-10-CM | POA: Diagnosis not present

## 2022-01-27 NOTE — Assessment & Plan Note (Addendum)
Fibrocystic breast tissue, normal Recommending vit d 2,000 iu daily and vitamin E 200 IU , evening primrose oil 1000 mg  Area of pt concern is normal breast tissue on exam Encouraged self breast exams, to follow up with gyn for any further concerns

## 2022-01-27 NOTE — Progress Notes (Deleted)
      Established patient visit   Patient: Karen Carey   DOB: 1985-06-19   36 y.o. Female  MRN: [id number] Visit Date: 01/27/2022  Today's healthcare provider: Alfredia Ferguson, PA-C   No chief complaint on file.  Subjective    HPI  ***  Medications: Outpatient Medications Prior to Visit  Medication Sig   levothyroxine (SYNTHROID) 88 MCG tablet Take 1 tablet (88 mcg total) by mouth daily.   Facility-Administered Medications Prior to Visit  Medication Dose Route Frequency Provider   paragard intrauterine copper IUD   Intrauterine Once Copland, Alicia B, PA-C    Review of Systems  {Labs  Heme  Chem  Endocrine  Serology  Results Review (optional):23779}   Objective    There were no vitals taken for this visit. {Show previous vital signs (optional):23777}  Physical Exam  ***  No results found for any visits on 01/27/22.  Assessment & Plan     ***  No follow-ups on file.      {provider attestation***:1}   Alfredia Ferguson, PA-C  [facility] [phone] (phone) [phone] (fax)  [facility]

## 2022-01-27 NOTE — Patient Instructions (Addendum)
Vit D 2000 IU  Vit E 200 IU Evening primrose oil 1000 mg

## 2022-01-27 NOTE — Assessment & Plan Note (Signed)
Recommending restarting omeprazole. Given thyroid med- recommending she take 30 min before lunch or at bedtime.

## 2022-01-27 NOTE — Assessment & Plan Note (Signed)
Will check tsh/t4, cbc, cmp Ordered zio monitor Advised interaction between thyroid med and PPI  Decrease caffeine use

## 2022-01-27 NOTE — Assessment & Plan Note (Signed)
-  Advised smoking cessation

## 2022-01-27 NOTE — Progress Notes (Signed)
I,[name],acting as a Neurosurgeon for [employer], PA-C.,have documented all relevant documentation on the behalf of Alfredia Ferguson, PA-C,as directed by  Alfredia Ferguson, PA-C while in the presence of Alfredia Ferguson, PA-C.   Complete physical exam   Patient: Karen Carey   DOB: 11-Nov-1985   36 y.o. Female  MRN: [id number] Visit Date: 01/27/2022  Today's healthcare provider: Alfredia Ferguson, PA-C   Cc. cpe  Subjective    MAKENSEY REGO is a 36 y.o. female who presents today for a complete physical exam.  She reports consuming a general diet. Home exercise routine includes walking .5 hrs per day. She generally feels well. She reports sleeping poorly. She does have additional problems to discuss today.  HPI  Pt would like to discus chronic fibrocystic breast pain and chest palpitations x 1 month. --Pt reports breast swelling a whole cup size the week or so before her cycle, pain starts three weeks before her cycle, only time she is pain free is when she is on her period. Reports trying OCPs and IUDs with no improvement in symptoms. L>R. She feels a more prominent painful firm area in her left breast x 1-2 years. No changes fluctuates with cycle.  Tobacco use -restarted smoking, around 4 cigarettes daily.  Increased acid reflux -self treated with otc omeprazole x 2 weeks, improvement but now worse since she stopped.  Heart palpitations -Around 1-3 times a week. Sometimes multiple times a day. Reports a skipped beat and then racing for around a minute. Self resolves. Sometimes takes her breath away. X 1-2 months.   Past Medical History:  Diagnosis Date   Anemia    Depression    Hypothyroidism    Mastalgia    Tobacco use    Past Surgical History:  Procedure Laterality Date   INTRAUTERINE DEVICE (IUD) INSERTION  2011   IUD REMOVAL  11/2010   Nexplanon  11/25/2010/ 09/26/2014   [facility]   Social History   Socioeconomic History   Marital status: Divorced     Spouse name: Not on file   Number of children: 1   Years of education: Not on file   Highest education level: Not on file  Occupational History   Occupation: International aid/development worker  Tobacco Use   Smoking status: Former    Packs/day: 1.00    Years: 12.00    Total pack years: 12.00    Types: Cigarettes    Quit date: 03/13/2021    Years since quitting: 0.8   Smokeless tobacco: Never  Vaping Use   Vaping Use: Never used  Substance and Sexual Activity   Alcohol use: Yes    Alcohol/week: 12.0 standard drinks of alcohol    Types: 12 Shots of liquor per week    Comment: 0-3 shots of liquor/night on 4 nights/week   Drug use: No   Sexual activity: Yes    Partners: Male    Birth control/protection: None  Other Topics Concern   Not on file  Social History Narrative   Not on file   Social Determinants of Health   Financial Resource Strain: Not on file  Food Insecurity: Not on file  Transportation Needs: Not on file  Physical Activity: Inactive (06/12/2017)   Exercise Vital Sign    Days of Exercise per Week: 0 days    Minutes of Exercise per Session: 0 min  Stress: Not on file  Social Connections: Not on file  Intimate Partner Violence: Not on file   Family Status  Relation Name Status   Mother  Alive   Father  Alive   Family History  Problem Relation Age of Onset   Diabetes Mother        Type 2   Hypertension Mother    Hypothyroidism Mother    COPD Mother    Depression Mother    Other Father        does not know biological father   No Known Allergies  Patient Care Team: Alfredia Ferguson, PA-C as PCP - General (Physician Assistant) Copland, Ilona Sorrel, PA-C as Referring Physician (Obstetrics and Gynecology)   Medications: Outpatient Medications Prior to Visit  Medication Sig   levothyroxine (SYNTHROID) 88 MCG tablet Take 1 tablet (88 mcg total) by mouth daily.   [DISCONTINUED] paragard intrauterine copper IUD    No facility-administered medications prior to visit.     Review of Systems  Constitutional:  Positive for fatigue.  All other systems reviewed and are negative.   Objective    Blood pressure 120/83, pulse 100, temperature 97.9 F (36.6 C), temperature source Oral, height 5\' 2"  (1.575 m), weight 161 lb 6.4 oz (73.2 kg), SpO2 100 %.    Physical Exam Constitutional:      General: She is awake.     Appearance: She is well-developed. She is not ill-appearing.  HENT:     Head: Normocephalic.     Right Ear: Tympanic membrane normal.     Left Ear: Tympanic membrane normal.     Nose: Nose normal. No congestion or rhinorrhea.     Mouth/Throat:     Pharynx: No oropharyngeal exudate or posterior oropharyngeal erythema.  Eyes:     Conjunctiva/sclera: Conjunctivae normal.     Pupils: Pupils are equal, round, and reactive to light.  Neck:     Thyroid: No thyroid mass or thyromegaly.  Cardiovascular:     Rate and Rhythm: Normal rate and regular rhythm.     Heart sounds: Normal heart sounds.  Pulmonary:     Effort: Pulmonary effort is normal.     Breath sounds: Normal breath sounds.  Chest:     Comments: L breast 11:00 there is normal, soft, mobile breast tissue that is tender to touch. Area of patient concern. No masses appreciated Abdominal:     Palpations: Abdomen is soft.     Tenderness: There is no abdominal tenderness.  Musculoskeletal:     Right lower leg: No swelling. No edema.     Left lower leg: No swelling. No edema.  Lymphadenopathy:     Cervical: No cervical adenopathy.  Skin:    General: Skin is warm.  Neurological:     Mental Status: She is alert and oriented to person, place, and time.  Psychiatric:        Attention and Perception: Attention normal.        Mood and Affect: Mood normal.        Speech: Speech normal.        Behavior: Behavior normal. Behavior is cooperative.     Last depression screening scores    01/27/2022    9:35 AM 07/24/2021   10:44 AM  PHQ 2/9 Scores  PHQ - 2 Score 0 3  PHQ- 9 Score 4 18    Last fall risk screening    01/27/2022    9:35 AM  Fall Risk   Falls in the past year? 0  Number falls in past yr: 0  Injury with Fall? 0   Last Audit-C alcohol use screening  01/27/2022    9:35 AM  Alcohol Use Disorder Test (AUDIT)  1. How often do you have a drink containing alcohol? 2  2. How many drinks containing alcohol do you have on a typical day when you are drinking? 0  3. How often do you have six or more drinks on one occasion? 1  AUDIT-C Score 3   A score of 3 or more in women, and 4 or more in men indicates increased risk for alcohol abuse, EXCEPT if all of the points are from question 1   No results found for any visits on 01/27/22.  Assessment & Plan    Routine Health Maintenance and Physical Exam  Exercise Activities and Dietary recommendations --balanced diet high in fiber and protein, low in sugars, carbs, fats. --physical activity/exercise 30 minutes 3-5 times a week       Immunization History  Administered Date(s) Administered   DTaP 07/20/2014   Hepatitis B 07/20/2014   Moderna Sars-Covid-2 Vaccination 12/14/2020    Health Maintenance  Topic Date Due   COVID-19 Vaccine (2 - 2023-24 season) 10/11/2021   INFLUENZA VACCINE  05/11/2022 (Originally 09/10/2021)   PAP SMEAR-Modifier  09/07/2022   DTaP/Tdap/Td (2 - Tdap) 07/19/2024   Hepatitis C Screening  Completed   HIV Screening  Completed   HPV VACCINES  Aged Out    Discussed health benefits of physical activity, and encouraged her to engage in regular exercise appropriate for her age and condition.  Problem List Items Addressed This Visit       Digestive   Gastroesophageal reflux disease    Recommending restarting omeprazole. Given thyroid med- recommending she take 30 min before lunch or at bedtime.         Other   Breast pain    Fibrocystic breast tissue, normal Recommending vit d 2,000 iu daily and vitamin E 200 IU , evening primrose oil 1000 mg  Area of pt concern is normal  breast tissue on exam Encouraged self breast exams, to follow up with gyn for any further concerns      Tobacco use    Advised smoking cessation      Heart palpitations    Will check tsh/t4, cbc, cmp Ordered zio monitor Advised interaction between thyroid med and PPI  Decrease caffeine use      Relevant Orders   TSH + free T4   CBC w/Diff/Platelet   Comprehensive Metabolic Panel (CMET)   Iron, TIBC and Ferritin Panel   LONG TERM MONITOR (3-14 DAYS)   Other Visit Diagnoses     Annual physical exam    -  Primary      Pt declines flu vaccine  Return in about 6 months (around 07/29/2022) for chronic conditions.     I, Alfredia Ferguson, PA-C have reviewed all documentation for this visit. The documentation on  01/27/2022  for the exam, diagnosis, procedures, and orders are all accurate and complete.  Alfredia Ferguson, PA-C [facility] [address] [place], [place], [address] Office: [phone] Fax: [phone]   [facility]

## 2022-01-28 ENCOUNTER — Other Ambulatory Visit: Payer: Self-pay | Admitting: Physician Assistant

## 2022-01-28 LAB — COMPREHENSIVE METABOLIC PANEL
ALT: 22 IU/L (ref 0–32)
AST: 24 IU/L (ref 0–40)
Albumin/Globulin Ratio: 1.9 (ref 1.2–2.2)
Albumin: 4.5 g/dL (ref 3.9–4.9)
Alkaline Phosphatase: 42 IU/L — ABNORMAL LOW (ref 44–121)
BUN/Creatinine Ratio: 14 (ref 9–23)
BUN: 10 mg/dL (ref 6–20)
Bilirubin Total: 0.6 mg/dL (ref 0.0–1.2)
CO2: 21 mmol/L (ref 20–29)
Calcium: 10.2 mg/dL (ref 8.7–10.2)
Chloride: 102 mmol/L (ref 96–106)
Creatinine, Ser: 0.74 mg/dL (ref 0.57–1.00)
Globulin, Total: 2.4 g/dL (ref 1.5–4.5)
Glucose: 102 mg/dL — ABNORMAL HIGH (ref 70–99)
Potassium: 4.1 mmol/L (ref 3.5–5.2)
Sodium: 140 mmol/L (ref 134–144)
Total Protein: 6.9 g/dL (ref 6.0–8.5)
eGFR: 109 mL/min/{1.73_m2} (ref >59)

## 2022-01-28 LAB — CBC WITH DIFFERENTIAL/PLATELET
Basophils Absolute: 0.1 10*3/uL (ref 0.0–0.2)
Basos: 1 %
EOS (ABSOLUTE): 0.2 10*3/uL (ref 0.0–0.4)
Eos: 2 %
Hematocrit: 41.5 % (ref 34.0–46.6)
Hemoglobin: 13.9 g/dL (ref 11.1–15.9)
Immature Grans (Abs): 0 10*3/uL (ref 0.0–0.1)
Immature Granulocytes: 0 %
Lymphocytes Absolute: 3 10*3/uL (ref 0.7–3.1)
Lymphs: 32 %
MCH: 28.3 pg (ref 26.6–33.0)
MCHC: 33.5 g/dL (ref 31.5–35.7)
MCV: 84 fL (ref 79–97)
Monocytes Absolute: 0.7 10*3/uL (ref 0.1–0.9)
Monocytes: 8 %
Neutrophils Absolute: 5.3 10*3/uL (ref 1.4–7.0)
Neutrophils: 57 %
Platelets: 273 10*3/uL (ref 150–450)
RBC: 4.92 x10E6/uL (ref 3.77–5.28)
RDW: 11.9 % (ref 11.7–15.4)
WBC: 9.2 10*3/uL (ref 3.4–10.8)

## 2022-01-28 LAB — TSH+FREE T4
Free T4: 1.61 ng/dL (ref 0.82–1.77)
TSH: 1.83 u[IU]/mL (ref 0.450–4.500)

## 2022-01-28 LAB — IRON,TIBC AND FERRITIN PANEL
Ferritin: 215 ng/mL — ABNORMAL HIGH (ref 15–150)
Iron Saturation: 54 % (ref 15–55)
Iron: 150 ug/dL (ref 27–159)
Total Iron Binding Capacity: 276 ug/dL (ref 250–450)
UIBC: 126 ug/dL — ABNORMAL LOW (ref 131–425)

## 2022-01-29 DIAGNOSIS — R002 Palpitations: Secondary | ICD-10-CM | POA: Diagnosis not present

## 2022-01-30 ENCOUNTER — Encounter: Payer: Self-pay | Admitting: Physician Assistant

## 2022-01-31 ENCOUNTER — Ambulatory Visit (INDEPENDENT_AMBULATORY_CARE_PROVIDER_SITE_OTHER): Payer: BC Managed Care – PPO | Admitting: Family Medicine

## 2022-01-31 ENCOUNTER — Encounter: Payer: Self-pay | Admitting: Family Medicine

## 2022-01-31 VITALS — BP 122/89 | HR 98 | Temp 98.0°F | Wt 163.0 lb

## 2022-01-31 DIAGNOSIS — R311 Benign essential microscopic hematuria: Secondary | ICD-10-CM | POA: Insufficient documentation

## 2022-01-31 DIAGNOSIS — R350 Frequency of micturition: Secondary | ICD-10-CM | POA: Insufficient documentation

## 2022-01-31 LAB — POCT URINALYSIS DIPSTICK
Bilirubin, UA: NEGATIVE
Glucose, UA: NEGATIVE
Ketones, UA: NEGATIVE
Leukocytes, UA: NEGATIVE
Nitrite, UA: NEGATIVE
Protein, UA: NEGATIVE
Spec Grav, UA: <=1.005 — AB (ref 1.010–1.025)
Urobilinogen, UA: NEGATIVE E.U./dL — AB
pH, UA: 7.5 (ref 5.0–8.0)

## 2022-01-31 MED ORDER — FLUCONAZOLE 150 MG PO TABS: ORAL_TABLET | ORAL | 0 refills | Status: DC

## 2022-01-31 MED ORDER — NITROFURANTOIN MONOHYD MACRO 100 MG PO CAPS: 100.0000 mg | ORAL_CAPSULE | Freq: Two times a day (BID) | ORAL | 0 refills | Status: DC

## 2022-01-31 NOTE — Assessment & Plan Note (Signed)
Symptoms <12 hours; denies concern for STI or constipation UA + for blood; Ucx sent. Printed treatment plan if Ucx is positive over holiday weekend Seek emergent care if needed; ROS are at baseline per pt report

## 2022-01-31 NOTE — Progress Notes (Signed)
I,Karen Carey,acting as a Neurosurgeon for Karen Kindle, FNP.,have documented all relevant documentation on the behalf of Karen Kindle, FNP,as directed by  Karen Kindle, FNP while in the presence of Karen Kindle, FNP.   Established patient visit   Patient: Karen Carey   DOB: 1985/02/18   36 y.o. Female  MRN: [id number] Visit Date: 01/31/2022  Today's healthcare provider: [name], FNP  Patient presents for new patient visit to establish care.  Introduced to Publishing rights manager role and practice setting.  All questions answered.  Discussed provider/patient relationship and expectations.   Chief Complaint  Patient presents with   Urinary Tract Infection   Subjective    HPI  Urinary symptoms  She reports new onset urinary frequency with pain. The current episode started this morning and is worsening. Patient states symptoms are moderate in intensity, occurring constantly. She  has not been recently treated for similar symptoms.    Associated symptoms: Yes abdominal pain Yes back pain  Yes chills No constipation  Yes cramping Yes diarrhea  Yes discharge No fever  No hematuria No nausea  No vomiting    ---------------------------------------------------------------------------------------   Medications: Outpatient Medications Prior to Visit  Medication Sig   levothyroxine (SYNTHROID) 88 MCG tablet Take 1 tablet (88 mcg total) by mouth daily.   No facility-administered medications prior to visit.    Review of Systems    Objective    BP 122/89 (BP Location: Right Arm, Patient Position: Sitting, Cuff Size: Normal)   Pulse 98   Temp 98 F (36.7 C) (Oral)   Wt 163 lb (73.9 kg)   SpO2 100%   BMI 29.81 kg/m   Physical Exam Vitals and nursing note reviewed.  Constitutional:      General: She is not in acute distress.    Appearance: Normal appearance. She is overweight. She is not ill-appearing, toxic-appearing or diaphoretic.  HENT:     Head:  Normocephalic and atraumatic.  Cardiovascular:     Rate and Rhythm: Normal rate and regular rhythm.     Pulses: Normal pulses.     Heart sounds: Normal heart sounds. No murmur heard.    No friction rub. No gallop.  Pulmonary:     Effort: Pulmonary effort is normal. No respiratory distress.     Breath sounds: Normal breath sounds. No stridor. No wheezing, rhonchi or rales.  Chest:     Chest wall: No tenderness.  Abdominal:     General: Bowel sounds are normal.     Palpations: Abdomen is soft.     Tenderness: There is no abdominal tenderness. There is no right CVA tenderness or left CVA tenderness.  Musculoskeletal:        General: No swelling, tenderness, deformity or signs of injury. Normal range of motion.     Right lower leg: No edema.     Left lower leg: No edema.  Skin:    General: Skin is warm and dry.     Capillary Refill: Capillary refill takes less than 2 seconds.     Coloration: Skin is not jaundiced or pale.     Findings: No bruising, erythema, lesion or rash.  Neurological:     General: No focal deficit present.     Mental Status: She is alert and oriented to person, place, and time. Mental status is at baseline.     Cranial Nerves: No cranial nerve deficit.     Sensory: No sensory deficit.  Motor: No weakness.     Coordination: Coordination normal.  Psychiatric:        Mood and Affect: Mood normal.        Behavior: Behavior normal.        Thought Content: Thought content normal.        Judgment: Judgment normal.     Results for orders placed or performed in visit on 01/31/22  POCT Urinalysis Dipstick  Result Value Ref Range   Color, UA light yellow    Clarity, UA clear    Glucose, UA Negative Negative   Bilirubin, UA negative    Ketones, UA negative    Spec Grav, UA <=1.005 (A) 1.010 - 1.025   Blood, UA small    pH, UA 7.5 5.0 - 8.0   Protein, UA Negative Negative   Urobilinogen, UA negative (A) 0.2 or 1.0 E.U./dL   Nitrite, UA negative     Leukocytes, UA Negative Negative   Appearance     Odor      Assessment & Plan     Problem List Items Addressed This Visit       Genitourinary   Benign essential microscopic hematuria    Seen on UA; will send for Urine Cx Printed ABX and antifungal provided with use of ABX if Ucx is positive over holiday weekend Continue AZO OTC as well as pushing fluids to assist       Relevant Orders   Urine Culture     Other   Frequency of urination - Primary    Symptoms <12 hours; denies concern for STI or constipation UA + for blood; Ucx sent. Printed treatment plan if Ucx is positive over holiday weekend Seek emergent care if needed; ROS are at baseline per pt report       Relevant Orders   POCT Urinalysis Dipstick (Completed)   Urine Culture   Return if symptoms worsen or fail to improve.     Karen Merl, FNP, have reviewed all documentation for this visit. The documentation on 01/31/22 for the exam, diagnosis, procedures, and orders are all accurate and complete.  Karen Kindle, FNP  [facility] [phone] (phone) [phone] (fax)  [facility]

## 2022-01-31 NOTE — Assessment & Plan Note (Signed)
Seen on UA; will send for Urine Cx Printed ABX and antifungal provided with use of ABX if Ucx is positive over holiday weekend Continue AZO OTC as well as pushing fluids to assist

## 2022-02-01 DIAGNOSIS — N10 Acute tubulo-interstitial nephritis: Secondary | ICD-10-CM | POA: Diagnosis not present

## 2022-02-01 DIAGNOSIS — F17210 Nicotine dependence, cigarettes, uncomplicated: Secondary | ICD-10-CM | POA: Diagnosis not present

## 2022-02-01 DIAGNOSIS — Z79890 Hormone replacement therapy (postmenopausal): Secondary | ICD-10-CM | POA: Diagnosis not present

## 2022-02-01 DIAGNOSIS — R109 Unspecified abdominal pain: Secondary | ICD-10-CM | POA: Diagnosis not present

## 2022-02-01 DIAGNOSIS — R300 Dysuria: Secondary | ICD-10-CM | POA: Diagnosis not present

## 2022-02-01 DIAGNOSIS — E079 Disorder of thyroid, unspecified: Secondary | ICD-10-CM | POA: Diagnosis not present

## 2022-02-01 DIAGNOSIS — R9341 Abnormal radiologic findings on diagnostic imaging of renal pelvis, ureter, or bladder: Secondary | ICD-10-CM | POA: Diagnosis not present

## 2022-02-01 DIAGNOSIS — R1030 Lower abdominal pain, unspecified: Secondary | ICD-10-CM | POA: Diagnosis not present

## 2022-02-05 ENCOUNTER — Other Ambulatory Visit: Payer: Self-pay | Admitting: Family Medicine

## 2022-02-05 ENCOUNTER — Ambulatory Visit: Payer: Self-pay | Admitting: *Deleted

## 2022-02-05 LAB — URINE CULTURE

## 2022-02-05 MED ORDER — SULFAMETHOXAZOLE-TRIMETHOPRIM 800-160 MG PO TABS: 1.0000 | ORAL_TABLET | Freq: Two times a day (BID) | ORAL | 0 refills | Status: DC

## 2022-02-05 NOTE — Telephone Encounter (Signed)
Summary: rx concern   The patient would like to speak with a member of clinical staff when possible regarding their recent prescription of sulfamethoxazole-trimethoprim (BACTRIM DS) 800-160 MG tablet [[id number]]  The patient has also been seen in the ER and was prescribed medication there as well  The patient would like to know if they should continue taking their Cefdinir as well as their new prescriptions  Please contact the patient further when possible       Chief Complaint: Med Question Symptoms: Improving Frequency: na Pertinent Negatives: Patient denies na Disposition: [] ED /[] Urgent Care (no appt availability in office) / [] Appointment(In office/virtual)/ []  [facility]/ [] Home Care/ [] Refused Recommended Disposition /[] [facility]/ [x]  Follow-up with PCP Additional Notes: Pt states was prescribed Nitrofurantoin for UTI 01/31/22. States increasing pain sat went to ED and was prescribed Cefdinir. States pharmacy called today a prescription was available for Bactrim DS. Questioning which to take. After hours call. States she feels much better on Cefdinir. Advised to take that tonight, NT will route to practice for PCPs review and final disposition.  Please advise.   Reason for Disposition  [1] Caller has NON-URGENT medicine question about med that PCP prescribed AND [2] triager unable to answer question  Answer Assessment - Initial Assessment Questions 1. NAME of MEDICINE: "What medicine(s) are you calling about?"     Cefdinir 2. QUESTION: "What is your question?" (e.g., double dose of medicine, side effect)     What should I take 3. PRESCRIBER: "Who prescribed the medicine?" Reason: if prescribed by specialist, call should be referred to that group.     ED 4. SYMPTOMS: "Do you have any symptoms?" If Yes, ask: "What symptoms are you having?"  "How bad are the symptoms (e.g., mild, moderate, severe)     *No Answer* 5. PREGNANCY:  "Is there any chance that  you are pregnant?" "When was your last menstrual period?"     *No Answer*  Protocols used: Medication Question Call-A-AH

## 2022-02-13 DIAGNOSIS — R002 Palpitations: Secondary | ICD-10-CM | POA: Diagnosis not present

## 2022-02-27 ENCOUNTER — Other Ambulatory Visit: Payer: Self-pay | Admitting: Physician Assistant

## 2022-02-27 DIAGNOSIS — E039 Hypothyroidism, unspecified: Secondary | ICD-10-CM

## 2022-02-27 MED ORDER — LEVOTHYROXINE SODIUM 88 MCG PO TABS: 88.0000 ug | ORAL_TABLET | Freq: Every day | ORAL | 1 refills | Status: DC

## 2022-02-27 NOTE — Telephone Encounter (Signed)
Future visit in 1 week .  Requested Prescriptions  Pending Prescriptions Disp Refills   levothyroxine (SYNTHROID) 88 MCG tablet 90 tablet 1    Sig: Take 1 tablet (88 mcg total) by mouth daily.     Endocrinology:  Hypothyroid Agents Passed - 02/27/2022  3:41 PM      Passed - TSH in normal range and within 360 days    TSH  Date Value Ref Range Status  01/27/2022 1.830 0.450 - 4.500 uIU/mL Final         Passed - Valid encounter within last 12 months    Recent Outpatient Visits           3 weeks ago Frequency of urination   [facility] Gwyneth Sprout, FNP   1 month ago Annual physical exam   [facility] Thedore Mins, [place], PA-C   5 months ago Hypothyroidism, unspecified type   [facility] Thedore Mins, [place], PA-C   7 months ago Encounter to establish care   [facility] Mikey Kirschner, PA-C       Future Appointments             In 1 week Thedore Mins, Ria Comment, PA-C [facility], [place]

## 2022-02-27 NOTE — Telephone Encounter (Signed)
Medication Refill - Medication: levothyroxine (SYNTHROID) 88 MCG tablet   Has the patient contacted their pharmacy? Yes.     Pharmacy referred pt to PCP.   Preferred Pharmacy (with phone number or street name):  [facility], [address] Phone: [phone]  Fax: [phone]     Has the patient been seen for an appointment in the last year OR does the patient have an upcoming appointment? Yes.    Agent: Please be advised that RX refills may take up to 3 business days. We ask that you follow-up with your pharmacy.

## 2022-03-10 ENCOUNTER — Ambulatory Visit (INDEPENDENT_AMBULATORY_CARE_PROVIDER_SITE_OTHER): Payer: BC Managed Care – PPO | Admitting: Physician Assistant

## 2022-03-10 ENCOUNTER — Encounter: Payer: Self-pay | Admitting: Physician Assistant

## 2022-03-10 VITALS — BP 118/77 | HR 92 | Temp 98.0°F | Ht 62.0 in | Wt 160.3 lb

## 2022-03-10 DIAGNOSIS — R748 Abnormal levels of other serum enzymes: Secondary | ICD-10-CM

## 2022-03-10 NOTE — Progress Notes (Deleted)
      Established patient visit   Patient: Karen Carey   DOB: 02/03/1986   37 y.o. Female  MRN: [id number] Visit Date: 03/10/2022  Today's healthcare provider: Mikey Kirschner, PA-C   No chief complaint on file.  Subjective    HPI  ***  Medications: Outpatient Medications Prior to Visit  Medication Sig   fluconazole (DIFLUCAN) 150 MG tablet 150 mg PO for vaginal yeast complaint in s/o ABX use; repeat dose in 4 days if s/s remain   levothyroxine (SYNTHROID) 88 MCG tablet Take 1 tablet (88 mcg total) by mouth daily.   nitrofurantoin, macrocrystal-monohydrate, (MACROBID) 100 MG capsule Take 1 capsule (100 mg total) by mouth 2 (two) times daily.   sulfamethoxazole-trimethoprim (BACTRIM DS) 800-160 MG tablet Take 1 tablet by mouth 2 (two) times daily.   No facility-administered medications prior to visit.    Review of Systems  {Labs  Heme  Chem  Endocrine  Serology  Results Review (optional):23779}   Objective    There were no vitals taken for this visit. {Show previous vital signs (optional):23777}  Physical Exam  ***  No results found for any visits on 03/10/22.  Assessment & Plan     ***  No follow-ups on file.      {provider attestation***:1}   Mikey Kirschner, PA-C  [place] [phone] (phone) [phone] (fax)  [place]

## 2022-03-10 NOTE — Progress Notes (Signed)
      Established patient visit   Patient: Karen Carey   DOB: 27-Nov-1985   37 y.o. Female  MRN: [id number] Visit Date: 03/10/2022  Today's healthcare provider: Mikey Kirschner, PA-C   Cc. Elevated lfts  Subjective    HPI  Pt was seen in ED 02/01/22 for acute pyelonephritis and noticed her liver enzymes were elevated. She reports all her symptoms have resolved-- denies urinary symptoms, abdominal pain, fevers. Reports history of drinking heavily but denies recent or current alcohol consumption.  Medications: Outpatient Medications Prior to Visit  Medication Sig   levothyroxine (SYNTHROID) 88 MCG tablet Take 1 tablet (88 mcg total) by mouth daily.   fluconazole (DIFLUCAN) 150 MG tablet 150 mg PO for vaginal yeast complaint in s/o ABX use; repeat dose in 4 days if s/s remain (Patient not taking: Reported on 03/10/2022)   nitrofurantoin, macrocrystal-monohydrate, (MACROBID) 100 MG capsule Take 1 capsule (100 mg total) by mouth 2 (two) times daily. (Patient not taking: Reported on 03/10/2022)   sulfamethoxazole-trimethoprim (BACTRIM DS) 800-160 MG tablet Take 1 tablet by mouth 2 (two) times daily. (Patient not taking: Reported on 03/10/2022)   No facility-administered medications prior to visit.       Objective    Blood pressure 118/77, pulse 92, temperature 98 F (36.7 C), height 5\' 2"  (1.575 m), weight 160 lb 4.8 oz (72.7 kg), SpO2 100 %.   Physical Exam Vitals reviewed.  Constitutional:      Appearance: She is not ill-appearing.  HENT:     Head: Normocephalic.  Eyes:     Conjunctiva/sclera: Conjunctivae normal.  Cardiovascular:     Rate and Rhythm: Normal rate.  Pulmonary:     Effort: Pulmonary effort is normal. No respiratory distress.  Neurological:     General: No focal deficit present.     Mental Status: She is alert and oriented to person, place, and time.  Psychiatric:        Mood and Affect: Mood normal.        Behavior: Behavior normal.      No results  found for any visits on 03/10/22.  Assessment & Plan     Elevated liver enzymes Mild and in the setting of pyelonephritis. Reassured no history of prior elevation, labs done a few days earlier 12/18 WNR. Advised we could repeat, or wait until follow up and repeat at that time. As pt is asymptomatic, feels reassured and we will repeat at follow up .  Return in about 6 months (around 09/08/2022) for chronic conditions.      I, Mikey Kirschner, PA-C have reviewed all documentation for this visit. The documentation on  03/10/22 for the exam, diagnosis, procedures, and orders are all accurate and complete.  Mikey Kirschner, PA-C [facility] [address] [place], [place], [address] Office: [phone] Fax: [place]

## 2022-07-04 ENCOUNTER — Telehealth: Payer: Self-pay | Admitting: Physician Assistant

## 2022-08-05 ENCOUNTER — Ambulatory Visit: Payer: BC Managed Care – PPO | Admitting: Physician Assistant

## 2023-01-29 ENCOUNTER — Ambulatory Visit (INDEPENDENT_AMBULATORY_CARE_PROVIDER_SITE_OTHER): Payer: Commercial Managed Care - PPO | Admitting: Family Medicine

## 2023-01-29 ENCOUNTER — Encounter: Payer: Self-pay | Admitting: Family Medicine

## 2023-01-29 VITALS — BP 118/91 | Ht 61.0 in | Wt 158.0 lb

## 2023-01-29 DIAGNOSIS — K5909 Other constipation: Secondary | ICD-10-CM | POA: Diagnosis not present

## 2023-01-29 DIAGNOSIS — F32A Depression, unspecified: Secondary | ICD-10-CM

## 2023-01-29 DIAGNOSIS — R311 Benign essential microscopic hematuria: Secondary | ICD-10-CM

## 2023-01-29 DIAGNOSIS — F419 Anxiety disorder, unspecified: Secondary | ICD-10-CM

## 2023-01-29 DIAGNOSIS — Z0000 Encounter for general adult medical examination without abnormal findings: Secondary | ICD-10-CM

## 2023-01-29 DIAGNOSIS — K2100 Gastro-esophageal reflux disease with esophagitis, without bleeding: Secondary | ICD-10-CM | POA: Diagnosis not present

## 2023-01-29 DIAGNOSIS — E039 Hypothyroidism, unspecified: Secondary | ICD-10-CM | POA: Diagnosis not present

## 2023-01-29 DIAGNOSIS — N83202 Unspecified ovarian cyst, left side: Secondary | ICD-10-CM

## 2023-01-29 DIAGNOSIS — L811 Chloasma: Secondary | ICD-10-CM

## 2023-01-29 NOTE — Progress Notes (Signed)
Complete physical exam   Patient: Karen Carey   DOB: 1985/09/04   37 y.o. Female  MRN: [id number] Visit Date: 01/29/2023  Today's healthcare provider: [name], FNP  Introduced to nurse practitioner role and practice setting.  All questions answered.  Discussed provider/patient relationship and expectations.  Chief Complaint  Patient presents with   Follow-up    Pt stated--requesting to get back on the thyroid meds. Top stomach pain and struggle w/ constipation--1 week   Subjective    Karen Carey is a 37 y.o. female who presents today for a complete physical exam.  She reports consuming a general diet. Home exercise routine includes walking 0.3 hrs per day. She generally feels poorly. She reports sleeping poorly. She does have additional problems to discuss today.   HPI HPI     Follow-up    Additional comments: Pt stated--requesting to get back on the thyroid meds. Top stomach pain and struggle w/ constipation--1 week      Last edited by Shelly Bombard, CMA on 01/29/2023  8:13 AM.      Past Medical History:  Diagnosis Date   Anemia    Depression    Hypothyroidism    Mastalgia    Tobacco use    Past Surgical History:  Procedure Laterality Date   INTRAUTERINE DEVICE (IUD) INSERTION  2011   IUD REMOVAL  11/2010   Nexplanon  11/25/2010/ 09/26/2014   [facility]   Social History   Socioeconomic History   Marital status: Divorced    Spouse name: Not on file   Number of children: 1   Years of education: Not on file   Highest education level: GED or equivalent  Occupational History   Occupation: International aid/development worker  Tobacco Use   Smoking status: Former    Current packs/day: 0.00    Average packs/day: 1 pack/day for 12.0 years (12.0 ttl pk-yrs)    Types: Cigarettes    Start date: 03/13/2009    Quit date: 03/13/2021    Years since quitting: 1.8   Smokeless tobacco: Never  Vaping Use   Vaping status: Never Used  Substance and Sexual Activity    Alcohol use: Yes    Alcohol/week: 12.0 standard drinks of alcohol    Types: 12 Shots of liquor per week    Comment: 0-3 shots of liquor/night on 4 nights/week   Drug use: No   Sexual activity: Yes    Partners: Male    Birth control/protection: None  Other Topics Concern   Not on file  Social History Narrative   Not on file   Social Drivers of Health   Financial Resource Strain: Medium Risk (01/28/2023)   Overall Financial Resource Strain (CARDIA)    Difficulty of Paying Living Expenses: Somewhat hard  Food Insecurity: Food Insecurity Present (01/28/2023)   Hunger Vital Sign    Worried About Running Out of Food in the Last Year: Sometimes true    Ran Out of Food in the Last Year: Sometimes true  Transportation Needs: No Transportation Needs (01/28/2023)   PRAPARE - Administrator, Civil Service (Medical): No    Lack of Transportation (Non-Medical): No  Physical Activity: Insufficiently Active (01/28/2023)   Exercise Vital Sign    Days of Exercise per Week: 4 days    Minutes of Exercise per Session: 20 min  Stress: Stress Concern Present (01/28/2023)   [employer] of Occupational Health - Occupational Stress Questionnaire    Feeling of Stress :  Rather much  Social Connections: Socially Isolated (01/28/2023)   Social Connection and Isolation Panel [NHANES]    Frequency of Communication with Friends and Family: Twice a week    Frequency of Social Gatherings with Friends and Family: Once a week    Attends Religious Services: Never    Database administrator or Organizations: No    Attends Engineer, structural: Not on file    Marital Status: Divorced  Catering manager Violence: Not on file   Family Status  Relation Name Status   Mother  Alive   Father  Alive  No partnership data on file   Family History  Problem Relation Age of Onset   Diabetes Mother        Type 2   Hypertension Mother    Hypothyroidism Mother    COPD Mother    Depression  Mother    Heart attack Mother    Other Father        does not know biological father   No Known Allergies  Patient Care Team: Sallee Provencal, FNP as PCP - General (Family Medicine) Copland, Ilona Sorrel, PA-C as Referring Physician (Obstetrics and Gynecology)   Medications: Outpatient Medications Prior to Visit  Medication Sig   levothyroxine (SYNTHROID) 88 MCG tablet Take 1 tablet (88 mcg total) by mouth daily. (Patient not taking: Reported on 01/29/2023)   [DISCONTINUED] fluconazole (DIFLUCAN) 150 MG tablet 150 mg PO for vaginal yeast complaint in s/o ABX use; repeat dose in 4 days if s/s remain (Patient not taking: Reported on 03/10/2022)   [DISCONTINUED] nitrofurantoin, macrocrystal-monohydrate, (MACROBID) 100 MG capsule Take 1 capsule (100 mg total) by mouth 2 (two) times daily. (Patient not taking: Reported on 03/10/2022)   [DISCONTINUED] sulfamethoxazole-trimethoprim (BACTRIM DS) 800-160 MG tablet Take 1 tablet by mouth 2 (two) times daily. (Patient not taking: Reported on 03/10/2022)   No facility-administered medications prior to visit.   Last CBC Lab Results  Component Value Date   WBC 9.2 01/27/2022   HGB 13.9 01/27/2022   HCT 41.5 01/27/2022   MCV 84 01/27/2022   MCH 28.3 01/27/2022   RDW 11.9 01/27/2022   PLT 273 01/27/2022   Last metabolic panel Lab Results  Component Value Date   GLUCOSE 102 (H) 01/27/2022   NA 140 01/27/2022   K 4.1 01/27/2022   CL 102 01/27/2022   CO2 21 01/27/2022   BUN 10 01/27/2022   CREATININE 0.74 01/27/2022   EGFR 109 01/27/2022   CALCIUM 10.2 01/27/2022   PROT 6.9 01/27/2022   ALBUMIN 4.5 01/27/2022   LABGLOB 2.4 01/27/2022   AGRATIO 1.9 01/27/2022   BILITOT 0.6 01/27/2022   ALKPHOS 42 (L) 01/27/2022   AST 24 01/27/2022   ALT 22 01/27/2022   ANIONGAP 3 (L) 12/01/2014   Last lipids Lab Results  Component Value Date   CHOL 157 06/12/2017   HDL 32 (L) 06/12/2017   LDLCALC 99 06/12/2017   TRIG 130 06/12/2017   Last  hemoglobin A1c Lab Results  Component Value Date   HGBA1C 5.2 07/24/2021   Last thyroid functions Lab Results  Component Value Date   TSH 1.830 01/27/2022   T4TOTAL 10.8 10/24/2021   Last vitamin D No results found for: "25OHVITD2", "25OHVITD3", "VD25OH" Last vitamin B12 and Folate No results found for: "VITAMINB12", "FOLATE"    Objective    BP (!) 118/91 (BP Location: Left Arm, Patient Position: Sitting, Cuff Size: Normal)   Ht 5\' 1"  (1.549 m)   Wt 158  lb (71.7 kg)   BMI 29.85 kg/m   BP Readings from Last 3 Encounters:  01/29/23 (!) 118/91  03/10/22 118/77  01/31/22 122/89   Wt Readings from Last 3 Encounters:  01/29/23 158 lb (71.7 kg)  03/10/22 160 lb 4.8 oz (72.7 kg)  01/31/22 163 lb (73.9 kg)   SpO2 Readings from Last 3 Encounters:  03/10/22 100%  01/31/22 100%  01/27/22 100%   Physical Exam Vitals and nursing note reviewed.  Constitutional:      General: She is awake. She is not in acute distress.    Appearance: Normal appearance. She is well-developed, well-groomed and overweight. She is not ill-appearing, toxic-appearing or diaphoretic.  HENT:     Head: Normocephalic and atraumatic.     Jaw: There is normal jaw occlusion. No trismus, tenderness, swelling or pain on movement.     Right Ear: Hearing, tympanic membrane, ear canal and external ear normal. There is no impacted cerumen.     Left Ear: Hearing, tympanic membrane, ear canal and external ear normal. There is no impacted cerumen.     Nose: Nose normal. No congestion or rhinorrhea.     Right Turbinates: Not enlarged, swollen or pale.     Left Turbinates: Not enlarged, swollen or pale.     Right Sinus: No maxillary sinus tenderness or frontal sinus tenderness.     Left Sinus: No maxillary sinus tenderness or frontal sinus tenderness.     Mouth/Throat:     Lips: Pink.     Mouth: Mucous membranes are moist. No injury.     Tongue: No lesions.     Pharynx: Oropharynx is clear. Uvula midline. No  pharyngeal swelling, oropharyngeal exudate, posterior oropharyngeal erythema or uvula swelling.     Tonsils: No tonsillar exudate or tonsillar abscesses.  Eyes:     General: Lids are normal. Lids are everted, no foreign bodies appreciated. Vision grossly intact. Gaze aligned appropriately. No allergic shiner or visual field deficit.       Right eye: No discharge.        Left eye: No discharge.     Extraocular Movements: Extraocular movements intact.     Conjunctiva/sclera: Conjunctivae normal.     Right eye: Right conjunctiva is not injected. No exudate.    Left eye: Left conjunctiva is not injected. No exudate.    Pupils: Pupils are equal, round, and reactive to light.  Neck:     Thyroid: No thyroid mass, thyromegaly or thyroid tenderness.     Vascular: No carotid bruit.     Trachea: Trachea normal.  Cardiovascular:     Rate and Rhythm: Normal rate and regular rhythm.     Pulses: Normal pulses.          Carotid pulses are 2+ on the right side and 2+ on the left side.      Radial pulses are 2+ on the right side and 2+ on the left side.       Dorsalis pedis pulses are 2+ on the right side and 2+ on the left side.       Posterior tibial pulses are 2+ on the right side and 2+ on the left side.     Heart sounds: Normal heart sounds, S1 normal and S2 normal. No murmur heard.    No friction rub. No gallop.  Pulmonary:     Effort: Pulmonary effort is normal. No respiratory distress.     Breath sounds: Normal breath sounds and air entry. No stridor. No wheezing,  rhonchi or rales.  Chest:     Chest wall: No tenderness.  Abdominal:     General: Abdomen is flat. Bowel sounds are normal. There is no distension.     Palpations: Abdomen is soft. There is no mass.     Tenderness: There is no abdominal tenderness. There is no right CVA tenderness, left CVA tenderness, guarding or rebound.     Hernia: No hernia is present.  Genitourinary:    Comments: Exam deferred; denies  complaints Musculoskeletal:        General: No swelling, tenderness, deformity or signs of injury. Normal range of motion.     Cervical back: Full passive range of motion without pain, normal range of motion and neck supple. No edema, rigidity or tenderness. No muscular tenderness.     Right lower leg: No edema.     Left lower leg: No edema.  Lymphadenopathy:     Cervical: No cervical adenopathy.     Right cervical: No superficial, deep or posterior cervical adenopathy.    Left cervical: No superficial, deep or posterior cervical adenopathy.  Skin:    General: Skin is warm and dry.     Capillary Refill: Capillary refill takes less than 2 seconds.     Coloration: Skin is not jaundiced or pale.     Findings: No bruising, erythema, lesion or rash.  Neurological:     General: No focal deficit present.     Mental Status: She is alert and oriented to person, place, and time. Mental status is at baseline.     GCS: GCS eye subscore is 4. GCS verbal subscore is 5. GCS motor subscore is 6.     Sensory: Sensation is intact. No sensory deficit.     Motor: Motor function is intact. No weakness.     Coordination: Coordination is intact. Coordination normal.     Gait: Gait is intact. Gait normal.  Psychiatric:        Attention and Perception: Attention and perception normal.        Mood and Affect: Mood and affect normal.        Speech: Speech normal.        Behavior: Behavior normal. Behavior is cooperative.        Thought Content: Thought content normal.        Cognition and Memory: Cognition and memory normal.        Judgment: Judgment normal.     Last depression screening scores    01/29/2023    8:10 AM 03/10/2022    9:28 AM 01/31/2022    9:43 AM  PHQ 2/9 Scores  PHQ - 2 Score 2 0 0  PHQ- 9 Score 11  1   Last fall risk screening    03/10/2022    9:28 AM  Fall Risk   Falls in the past year? 0  Number falls in past yr: 0  Injury with Fall? 0   Last Audit-C alcohol use  screening    01/28/2023    8:29 AM  Alcohol Use Disorder Test (AUDIT)  1. How often do you have a drink containing alcohol? 2  2. How many drinks containing alcohol do you have on a typical day when you are drinking? 0  3. How often do you have six or more drinks on one occasion? 1  AUDIT-C Score 3   4. How often during the last year have you found that you were not able to stop drinking once you had started? 0  5. How often during the last year have you failed to do what was normally expected from you because of drinking? 0  6. How often during the last year have you needed a first drink in the morning to get yourself going after a heavy drinking session? 0  7. How often during the last year have you had a feeling of guilt of remorse after drinking? 0  8. How often during the last year have you been unable to remember what happened the night before because you had been drinking? 0  9. Have you or someone else been injured as a result of your drinking? 0  10. Has a relative or friend or a doctor or another health worker been concerned about your drinking or suggested you cut down? 0  Alcohol Use Disorder Identification Test Final Score (AUDIT) 3      Patient-reported   A score of 3 or more in women, and 4 or more in men indicates increased risk for alcohol abuse, EXCEPT if all of the points are from question 1   No results found for any visits on 01/29/23.  Assessment & Plan    Routine Health Maintenance and Physical Exam  Exercise Activities and Dietary recommendations  Goals   None     Immunization History  Administered Date(s) Administered   DTaP 07/20/2014   Hepatitis B 07/20/2014   Moderna Sars-Covid-2 Vaccination 12/14/2020    Health Maintenance  Topic Date Due   Cervical Cancer Screening (Pap smear)  09/07/2022   COVID-19 Vaccine (2 - 2024-25 season) 10/12/2022   INFLUENZA VACCINE  05/11/2023 (Originally 09/11/2022)   DTaP/Tdap/Td (2 - Tdap) 07/19/2024   Hepatitis C  Screening  Completed   HIV Screening  Completed   HPV VACCINES  Aged Out    Discussed health benefits of physical activity, and encouraged her to engage in regular exercise appropriate for her age and condition.  Problem List Items Addressed This Visit       Digestive   Chronic constipation   Chronic, worsening Encouraged to start daily TID miralax and cycle down to fit to 1 BM every other day or to a level of comfort that would assist without bloating       Gastroesophageal reflux disease   Chronic, variable Previous recommendation to restarting omeprazole; however, pt has been using PRN pepcid and tums Given thyroid med- recommending she take 30 min before lunch or at bedtime.  Continue to monitor         Endocrine   Hypothyroidism   Chronic, untreated x3-4 months d/t change in insurance status Last on 88 mcg synthroid Repeat labs Continue to monitor following restart- advised dosing may be different given time without medication Chronic fatigue noted      RESOLVED: Left ovarian cyst     Musculoskeletal and Integument   RESOLVED: Melasma     Genitourinary   Benign essential microscopic hematuria   Previous noted; recommend repeat UA to assist        Other   Annual physical exam - Primary   Pt reports UTD on dental and vision Advised due for PAP; last seen 7/23 with high risk HPV findings Things to do to keep yourself healthy  - Exercise at least 30-45 minutes a day, 3-4 days a week.  - Eat a low-fat diet with lots of fruits and vegetables, up to 7-9 servings per day.  - Seatbelts can save your life. Wear them always.  - Smoke detectors on every level  of your home, check batteries every year.  - Eye Doctor - have an eye exam every 1-2 years  - Safe sex - if you may be exposed to STDs, use a condom.  - Alcohol -  If you drink, do it moderately, less than 2 drinks per day.  - Health Care Power of Attorney. Choose someone to speak for you if you are not able.  -  Depression is common in our stressful world.If you're feeling down or losing interest in things you normally enjoy, please come in for a visit.  - Violence - If anyone is threatening or hurting you, please call immediately.       Relevant Orders   CBC with Differential/Platelet   Comprehensive Metabolic Panel (CMET)   TSH   Lipid panel   Hemoglobin A1c   Urine Microalbumin w/creat. 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    Leilani Merl, FNP, have reviewed all documentation for this visit. The documentation on 01/29/23 for the exam, diagnosis, procedures, and orders are all accurate and complete.  [name], FNP  [facility] [phone] (phone) [phone] (fax)  [facility]

## 2023-01-29 NOTE — Assessment & Plan Note (Signed)
Pt reports UTD on dental and vision Advised due for PAP; last seen 7/23 with high risk HPV findings Things to do to keep yourself healthy  - Exercise at least 30-45 minutes a day, 3-4 days a week.  - Eat a low-fat diet with lots of fruits and vegetables, up to 7-9 servings per day.  - Seatbelts can save your life. Wear them always.  - Smoke detectors on every level of your home, check batteries every year.  - Eye Doctor - have an eye exam every 1-2 years  - Safe sex - if you may be exposed to STDs, use a condom.  - Alcohol -  If you drink, do it moderately, less than 2 drinks per day.  - Health Care Power of Attorney. Choose someone to speak for you if you are not able.  - Depression is common in our stressful world.If you're feeling down or losing interest in things you normally enjoy, please come in for a visit.  - Violence - If anyone is threatening or hurting you, please call immediately.

## 2023-01-29 NOTE — Assessment & Plan Note (Signed)
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

## 2023-01-29 NOTE — Assessment & Plan Note (Signed)
Chronic, untreated x3-4 months d/t change in insurance status Last on 88 mcg synthroid Repeat labs Continue to monitor following restart- advised dosing may be different given time without medication Chronic fatigue noted

## 2023-01-29 NOTE — Patient Instructions (Signed)
Constipation options  -colace will change stool slipperiness can take 2x/day -miralax draws water in, mixed with beverage, can be taken 3x/day -metameucil will add fiber but can be constipating if poor hydration levels are present Will use for maintenance -senna/dulcolax/mag citrate can be used in case of worsening symptoms without normal BM

## 2023-01-29 NOTE — Assessment & Plan Note (Signed)
Previous noted; recommend repeat UA to assist

## 2023-01-29 NOTE — Assessment & Plan Note (Signed)
Chronic, variable Previous recommendation to restarting omeprazole; however, pt has been using PRN pepcid and tums Given thyroid med- recommending she take 30 min before lunch or at bedtime.  Continue to monitor

## 2023-01-29 NOTE — Assessment & Plan Note (Signed)
Chronic, worsening Encouraged to start daily TID miralax and cycle down to fit to 1 BM every other day or to a level of comfort that would assist without bloating

## 2023-01-30 ENCOUNTER — Other Ambulatory Visit: Payer: Self-pay | Admitting: Family Medicine

## 2023-01-30 ENCOUNTER — Telehealth: Payer: Self-pay

## 2023-01-30 DIAGNOSIS — E039 Hypothyroidism, unspecified: Secondary | ICD-10-CM

## 2023-01-30 LAB — HEMOGLOBIN A1C
Est. average glucose Bld gHb Est-mCnc: 100 mg/dL
Hgb A1c MFr Bld: 5.1 % (ref 4.8–5.6)

## 2023-01-30 LAB — CBC WITH DIFFERENTIAL/PLATELET
Basophils Absolute: 0.1 10*3/uL (ref 0.0–0.2)
Basos: 1 %
EOS (ABSOLUTE): 0.1 10*3/uL (ref 0.0–0.4)
Eos: 2 %
Hematocrit: 37.1 % (ref 34.0–46.6)
Hemoglobin: 12.2 g/dL (ref 11.1–15.9)
Immature Grans (Abs): 0 10*3/uL (ref 0.0–0.1)
Immature Granulocytes: 0 %
Lymphocytes Absolute: 2.8 10*3/uL (ref 0.7–3.1)
Lymphs: 34 %
MCH: 28.2 pg (ref 26.6–33.0)
MCHC: 32.9 g/dL (ref 31.5–35.7)
MCV: 86 fL (ref 79–97)
Monocytes Absolute: 0.5 10*3/uL (ref 0.1–0.9)
Monocytes: 6 %
Neutrophils Absolute: 4.8 10*3/uL (ref 1.4–7.0)
Neutrophils: 57 %
Platelets: 249 10*3/uL (ref 150–450)
RBC: 4.32 x10E6/uL (ref 3.77–5.28)
RDW: 11.8 % (ref 11.7–15.4)
WBC: 8.2 10*3/uL (ref 3.4–10.8)

## 2023-01-30 LAB — TSH: TSH: 116 u[IU]/mL — ABNORMAL HIGH (ref 0.450–4.500)

## 2023-01-30 LAB — MICROALBUMIN / CREATININE URINE RATIO
Creatinine, Urine: 165.6 mg/dL
Microalb/Creat Ratio: 3 mg/g{creat} (ref 0–29)
Microalbumin, Urine: 5.2 ug/mL

## 2023-01-30 LAB — COMPREHENSIVE METABOLIC PANEL
ALT: 11 [IU]/L (ref 0–32)
AST: 15 [IU]/L (ref 0–40)
Albumin: 4.4 g/dL (ref 3.9–4.9)
Alkaline Phosphatase: 36 [IU]/L — ABNORMAL LOW (ref 44–121)
BUN/Creatinine Ratio: 12 (ref 9–23)
BUN: 11 mg/dL (ref 6–20)
Bilirubin Total: 0.3 mg/dL (ref 0.0–1.2)
CO2: 22 mmol/L (ref 20–29)
Calcium: 9.4 mg/dL (ref 8.7–10.2)
Chloride: 104 mmol/L (ref 96–106)
Creatinine, Ser: 0.9 mg/dL (ref 0.57–1.00)
Globulin, Total: 2.6 g/dL (ref 1.5–4.5)
Glucose: 84 mg/dL (ref 70–99)
Potassium: 4.3 mmol/L (ref 3.5–5.2)
Sodium: 138 mmol/L (ref 134–144)
Total Protein: 7 g/dL (ref 6.0–8.5)
eGFR: 85 mL/min/{1.73_m2} (ref >59)

## 2023-01-30 LAB — LIPID PANEL
Chol/HDL Ratio: 7 {ratio} — ABNORMAL HIGH (ref 0.0–4.4)
Cholesterol, Total: 223 mg/dL — ABNORMAL HIGH (ref 100–199)
HDL: 32 mg/dL — ABNORMAL LOW (ref >39)
LDL Chol Calc (NIH): 152 mg/dL — ABNORMAL HIGH (ref 0–99)
Triglycerides: 209 mg/dL — ABNORMAL HIGH (ref 0–149)
VLDL Cholesterol Cal: 39 mg/dL (ref 5–40)

## 2023-01-30 MED ORDER — LEVOTHYROXINE SODIUM 88 MCG PO TABS: 88.0000 ug | ORAL_TABLET | Freq: Every day | ORAL | 3 refills | Status: AC

## 2023-01-30 NOTE — Telephone Encounter (Signed)
Pt given lab results per notes of Robynn Pane on 01/30/23. Pt verbalized understanding. Patient stated she saw provider comments in MyChart and she will get the follow-up labs done in 4-6 weeks as recommended.  [name], FNP 01/30/2023  8:21 AM EST     Restart synthroid 88 mcg; plan to repeat TSH in 4-6 weeks with new PCP.   Cholesterol shows -elevated total -elevated fats -elevated bad -low good I continue to recommend diet low in saturated fat and regular exercise - 30 min at least 5 times per week   Urine pending.

## 2023-02-09 ENCOUNTER — Ambulatory Visit: Payer: Self-pay | Admitting: *Deleted

## 2023-02-09 NOTE — Telephone Encounter (Signed)
  Chief Complaint: cough Symptoms: Productive cough, greenish,SOB , headache, fever 100.8 Frequency: Friday HS Pertinent Negatives: Patient denies  Disposition: [] ED /[x] Urgent Care (no appt availability in office) / [] Appointment(In office/virtual)/ []  [place] [facility]/ [] Home Care/ [] Refused Recommended Disposition /[] [place] [facility]/ []  Follow-up with PCP Additional Notes: Advised UC, no availability at practice. Care advise provided, verbalizes understanding.  Reason for Disposition  Wheezing is present  Answer Assessment - Initial Assessment Questions 1. ONSET: "When did the cough begin?"      friday 2. SEVERITY: "How bad is the cough today?"      Bad 3. SPUTUM: "Describe the color of your sputum" (none, dry cough; clear, white, yellow, green)     greenish 4. HEMOPTYSIS: "Are you coughing up any blood?" If so ask: "How much?" (flecks, streaks, tablespoons, etc.)     no 5. DIFFICULTY BREATHING: "Are you having difficulty breathing?" If Yes, ask: "How bad is it?" (e.g., mild, moderate, severe)    - MILD: No SOB at rest, mild SOB with walking, speaks normally in sentences, can lie down, no retractions, pulse < 100.    - MODERATE: SOB at rest, SOB with minimal exertion and prefers to sit, cannot lie down flat, speaks in phrases, mild retractions, audible wheezing, pulse 100-120.    - SEVERE: Very SOB at rest, speaks in single words, struggling to breathe, sitting hunched forward, retractions, pulse > 120      Moderate 6. FEVER: "Do you have a fever?" If Yes, ask: "What is your temperature, how was it measured, and when did it start?"     Not presently. Prior to tylenol 100.8 7. CARDIAC HISTORY: "Do you have any history of heart disease?" (e.g., heart attack, congestive heart failure)      *No Answer* 8. LUNG HISTORY: "Do you have any history of lung disease?"  (e.g., pulmonary embolus, asthma, emphysema)     *No Answer* 9. PE RISK FACTORS: "Do you have a history of blood  clots?" (or: recent major surgery, recent prolonged travel, bedridden)     *No Answer* 10. OTHER SYMPTOMS: "Do you have any other symptoms?" (e.g., runny nose, wheezing, chest pain)       Wheezing, SOB at rest, speaking  Protocols used: Cough - Acute Productive-A-AH

## 2023-03-17 ENCOUNTER — Other Ambulatory Visit: Payer: Self-pay

## 2023-03-17 DIAGNOSIS — E039 Hypothyroidism, unspecified: Secondary | ICD-10-CM

## 2023-03-18 ENCOUNTER — Encounter: Payer: Self-pay | Admitting: Family Medicine

## 2023-03-18 LAB — TSH: TSH: 3.22 u[IU]/mL (ref 0.450–4.500)

## 2023-08-28 ENCOUNTER — Ambulatory Visit (INDEPENDENT_AMBULATORY_CARE_PROVIDER_SITE_OTHER): Admitting: Family Medicine

## 2023-08-28 ENCOUNTER — Encounter: Payer: Self-pay | Admitting: Family Medicine

## 2023-08-28 VITALS — BP 131/95 | HR 78 | Temp 98.1°F | Ht 62.0 in | Wt 160.4 lb

## 2023-08-28 DIAGNOSIS — R5383 Other fatigue: Secondary | ICD-10-CM

## 2023-08-28 DIAGNOSIS — M25471 Effusion, right ankle: Secondary | ICD-10-CM

## 2023-08-28 DIAGNOSIS — E039 Hypothyroidism, unspecified: Secondary | ICD-10-CM | POA: Diagnosis not present

## 2023-08-28 DIAGNOSIS — F419 Anxiety disorder, unspecified: Secondary | ICD-10-CM | POA: Diagnosis not present

## 2023-08-28 DIAGNOSIS — Z833 Family history of diabetes mellitus: Secondary | ICD-10-CM

## 2023-08-28 DIAGNOSIS — R002 Palpitations: Secondary | ICD-10-CM

## 2023-08-28 DIAGNOSIS — F32A Depression, unspecified: Secondary | ICD-10-CM

## 2023-08-28 DIAGNOSIS — M25512 Pain in left shoulder: Secondary | ICD-10-CM

## 2023-08-28 DIAGNOSIS — R030 Elevated blood-pressure reading, without diagnosis of hypertension: Secondary | ICD-10-CM

## 2023-08-28 DIAGNOSIS — G8929 Other chronic pain: Secondary | ICD-10-CM

## 2023-08-28 DIAGNOSIS — E782 Mixed hyperlipidemia: Secondary | ICD-10-CM

## 2023-08-28 DIAGNOSIS — M25472 Effusion, left ankle: Secondary | ICD-10-CM

## 2023-08-28 MED ORDER — MELOXICAM 7.5 MG PO TABS: 7.5000 mg | ORAL_TABLET | Freq: Every day | ORAL | 0 refills | Status: DC

## 2023-08-28 MED ORDER — ESCITALOPRAM OXALATE 10 MG PO TABS
10.0000 mg | ORAL_TABLET | Freq: Every day | ORAL | 0 refills | Status: DC
Start: 2023-08-28 — End: 2023-10-09

## 2023-08-28 NOTE — Progress Notes (Signed)
 Established Patient Office Visit  Introduced to nurse practitioner role and practice setting.  All questions answered.  Discussed provider/patient relationship and expectations.  Subjective   Patient ID: Karen Carey, female    DOB: 12/26/1985  Age: 38 y.o. MRN: [id number]  Chief Complaint  Patient presents with  . Acute Visit    - Nausea, fatigue, loss of appetite, heartburn about a month. - When trying to go to sleep and hearts start beating really fast due to racing thoughts. - Swelling and joint pain on both knees and left shoulder.  Shooting pain through the arm all the way to her fingers.   Discussed the use of AI scribe software for clinical note transcription with the patient, who gave verbal consent to proceed.  History of Present Illness Karen Carey is a 38 year old female who presents with nausea, fatigue, and mood changes.  She has been experiencing nausea, fatigue, and loss of appetite for the past month. The nausea is reminiscent of pregnancy symptoms, though she is certain she is not pregnant. Her appetite has significantly decreased, and she finds the smell of raw meat particularly unpleasant. Her menstrual periods have shortened, now lasting three to four days instead of seven to ten days.  She reports significant mood changes, including episodes of anger and sadness, with emotions being 'all over the place.' She has a history of anxiety and depression and was on antidepressants as a teenager, though she has not been on medication for many years. Her PHQ-9 score is 22.  She experiences heart palpitations, mainly at night, which feel like her heart is skipping a beat. This is accompanied by difficulty sleeping due to racing thoughts. She has a family history of heart issues, as her mother had a heart attack.  She has a history of hypothyroidism and is currently taking levothyroxine  88 mcg daily. She also takes melatonin nightly to aid with sleep. She previously  took omeprazole for heartburn but stopped due to interactions with her thyroid  medication, now managing with Tums as needed.  She reports chronic right shoulder pain, exacerbated by a recent sharp pain radiating down to her fingertips. This pain began a few days ago and is associated with a past injury where her dog tore a muscle in her shoulder. She has tried ibuprofen, Tylenol, and Aleve without relief.  She experiences swelling in her feet, particularly the right foot, which worsens throughout the day. She finds some relief by elevating her feet after work. She has tried compression socks but finds them uncomfortable due to itchiness.         08/28/2023    1:25 PM 01/29/2023    8:10 AM 03/10/2022    9:28 AM  Depression screen PHQ 2/9  Decreased Interest 3 2 0  Down, Depressed, Hopeless 3 0 0  PHQ - 2 Score 6 2 0  Altered sleeping 3 2   Tired, decreased energy 3 3   Change in appetite 3 1   Feeling bad or failure about yourself  2 1   Trouble concentrating 3 2   Moving slowly or fidgety/restless 2 0   Suicidal thoughts 0 0   PHQ-9 Score 22 11   Difficult doing work/chores Very difficult Not difficult at all        08/28/2023    1:25 PM 01/29/2023    8:10 AM 07/24/2021   10:54 AM  GAD 7 : Generalized Anxiety Score  Nervous, Anxious, on Edge 3 0 2  Control/stop  worrying 3 1 2   Worry too much - different things 3 1 3   Trouble relaxing 3 1 2   Restless 1 1 2   Easily annoyed or irritable 3 1 2   Afraid - awful might happen 1 0 1  Total GAD 7 Score 17 5 14   Anxiety Difficulty Very difficult Somewhat difficult Very difficult     ROS  Negative unless indicated in HPI   Objective:     BP (!) 131/95 (BP Location: Left Arm, Cuff Size: Normal)   Pulse 78   Temp 98.1 F (36.7 C) (Oral)   Ht 5' 2 (1.575 m)   Wt 160 lb 6.4 oz (72.8 kg)   SpO2 100%   BMI 29.34 kg/m    Physical Exam Vitals reviewed.  Constitutional:      General: She is not in acute distress.     Appearance: Normal appearance. She is obese. She is not toxic-appearing or diaphoretic.  HENT:     Head: Normocephalic.     Right Ear: Tympanic membrane, ear canal and external ear normal.     Left Ear: Tympanic membrane, ear canal and external ear normal.     Nose: Nose normal.     Mouth/Throat:     Mouth: Mucous membranes are moist.     Pharynx: Oropharynx is clear.  Eyes:     Extraocular Movements: Extraocular movements intact.     Conjunctiva/sclera: Conjunctivae normal.     Pupils: Pupils are equal, round, and reactive to light.  Cardiovascular:     Rate and Rhythm: Regular rhythm. Tachycardia present.     Pulses: Normal pulses.     Heart sounds: Normal heart sounds. No murmur heard.    No friction rub. No gallop.  Pulmonary:     Effort: No respiratory distress.     Breath sounds: No stridor. No wheezing, rhonchi or rales.  Chest:     Chest wall: No tenderness.  Abdominal:     General: Abdomen is flat. Bowel sounds are normal. There is no distension.     Palpations: Abdomen is soft. There is no mass.  Musculoskeletal:     Right shoulder: Normal.     Left shoulder: Tenderness, bony tenderness and crepitus present. No swelling. Decreased range of motion. Decreased strength. Normal pulse.     Right upper arm: Normal.     Left upper arm: Normal.     Right elbow: Normal.     Left elbow: Normal.     Right forearm: Normal.     Left forearm: Normal.     Right wrist: Normal.     Left wrist: Normal.     Right lower leg: No edema.     Left lower leg: No edema.  Skin:    General: Skin is warm and dry.     Capillary Refill: Capillary refill takes less than 2 seconds.  Neurological:     General: No focal deficit present.     Mental Status: She is alert and oriented to person, place, and time. Mental status is at baseline.  Psychiatric:        Mood and Affect: Mood normal.        Behavior: Behavior normal.        Thought Content: Thought content normal.        Judgment:  Judgment normal.      No results found for any visits on 08/28/23.    The ASCVD Risk score (Arnett DK, et al., 2019) failed to calculate for the  following reasons:   The 2019 ASCVD risk score is only valid for ages 50 to 54    Assessment & Plan:  Fatigue, unspecified type -     CBC -     Hemoglobin A1c -     Vitamin B12 -     VITAMIN D 25 Hydroxy (Vit-D Deficiency, Fractures) -     Sedimentation rate -     C-reactive protein  Anxiety and depression  Acquired hypothyroidism -     TSH  Palpitations -     Comprehensive metabolic panel with GFR -     LONG TERM MONITOR (3-14 DAYS)  Mixed hyperlipidemia -     Lipid panel  Chronic left shoulder pain -     Ambulatory referral to Orthopedics  Other orders -     Escitalopram Oxalate; Take 1 tablet (10 mg total) by mouth daily.  Dispense: 90 tablet; Refill: 0     Assessment and Plan Assessment & Plan Anxiety and Depression Severe depression with PHQ-9 score of 22. Symptoms include nausea, fatigue, loss of appetite, heartburn, racing heart, and mood swings. Discussed SSRI options, chose Lexapro 10 mg daily. - Prescribe Lexapro 10 mg daily. - Schedule virtual follow-up in six weeks to assess Lexapro effectiveness and adjust treatment.  Palpitations Pt states heart races and skips beats No chest pain, DOB, SOB Will order CMP and Zio patch  Elevated BP w/o diag of HTN GOAL<119/79 Monitor at home weekly Limit salt, increase exercise, weight loss  Hypothyroidism On levothyroxine  88 mcg. Symptoms of palpitations ,nausea, fatigue, loss of appetite, mood swings may indicate altered thyroid  function. Rechecking thyroid  levels is essential. - Recheck thyroid  levels. - Continue levothyroxine  88mcg under results come back. Left shoulder pain Chronic pain for three years, initially due to muscle tear. Recent sharp pain radiating to fingertips. Requires orthopedic evaluation. Agreed to try meloxicam. - Refer to orthopedics for  further evaluation. - Consider further assessments, possible injection, or MRI as recommended by orthopedics. - Prescribe meloxicam once daily for pain management.  Peripheral Edema Constant swelling in feet, especially right foot. 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 Compression socks cause itching. Swelling may be related to fluid retention. Physical exam in office unremarkable and no evidence of swelling.  - Advise wearing compression socks as tolerated. - Recommend elevating legs when at home. - Advise limiting salt intake. - Order labs to check electrolytes and kidney function.  General Health Maintenance Anemia contributing to fatigue. No recent diabetes screening, fam hx of DMII. Comprehensive labs needed for overall health assessment. - Order labs for inflammation markers, diabetes screening, anemia screening, vitamin B12 and D levels, and general electrolyte and kidney function.      Return in about 6 weeks (around 10/09/2023) for mood - virtual.    Karen DELENA Boom, FNP

## 2023-08-31 ENCOUNTER — Ambulatory Visit: Payer: Self-pay | Admitting: Family Medicine

## 2023-09-01 ENCOUNTER — Other Ambulatory Visit: Payer: Self-pay | Admitting: Family Medicine

## 2023-09-01 LAB — COMPREHENSIVE METABOLIC PANEL WITH GFR
ALT: 14 IU/L (ref 0–32)
AST: 17 IU/L (ref 0–40)
Albumin: 4.2 g/dL (ref 3.9–4.9)
Alkaline Phosphatase: 32 IU/L — ABNORMAL LOW (ref 44–121)
BUN/Creatinine Ratio: 9 (ref 9–23)
BUN: 7 mg/dL (ref 6–20)
Bilirubin Total: 0.5 mg/dL (ref 0.0–1.2)
CO2: 21 mmol/L (ref 20–29)
Calcium: 9 mg/dL (ref 8.7–10.2)
Chloride: 106 mmol/L (ref 96–106)
Creatinine, Ser: 0.78 mg/dL (ref 0.57–1.00)
Globulin, Total: 2.6 g/dL (ref 1.5–4.5)
Glucose: 86 mg/dL (ref 70–99)
Potassium: 3.9 mmol/L (ref 3.5–5.2)
Sodium: 141 mmol/L (ref 134–144)
Total Protein: 6.8 g/dL (ref 6.0–8.5)
eGFR: 102 mL/min/1.73 (ref >59)

## 2023-09-01 LAB — CBC
Hematocrit: 36.1 % (ref 34.0–46.6)
Hemoglobin: 11.7 g/dL (ref 11.1–15.9)
MCH: 28.1 pg (ref 26.6–33.0)
MCHC: 32.4 g/dL (ref 31.5–35.7)
MCV: 87 fL (ref 79–97)
Platelets: 224 x10E3/uL (ref 150–450)
RBC: 4.17 x10E6/uL (ref 3.77–5.28)
RDW: 12.1 % (ref 11.7–15.4)
WBC: 6.1 x10E3/uL (ref 3.4–10.8)

## 2023-09-01 LAB — LIPID PANEL
Chol/HDL Ratio: 4.9 ratio — ABNORMAL HIGH (ref 0.0–4.4)
Cholesterol, Total: 181 mg/dL (ref 100–199)
HDL: 37 mg/dL — ABNORMAL LOW (ref >39)
LDL Chol Calc (NIH): 121 mg/dL — ABNORMAL HIGH (ref 0–99)
Triglycerides: 127 mg/dL (ref 0–149)
VLDL Cholesterol Cal: 23 mg/dL (ref 5–40)

## 2023-09-01 LAB — SEDIMENTATION RATE: Sed Rate: 4 mm/h (ref 0–32)

## 2023-09-01 LAB — TSH: TSH: 1.68 u[IU]/mL (ref 0.450–4.500)

## 2023-09-01 LAB — HEMOGLOBIN A1C
Est. average glucose Bld gHb Est-mCnc: 94 mg/dL
Hgb A1c MFr Bld: 4.9 % (ref 4.8–5.6)

## 2023-09-01 LAB — VITAMIN B12: Vitamin B-12: 563 pg/mL (ref 232–1245)

## 2023-09-01 LAB — C-REACTIVE PROTEIN: CRP: <1 mg/L (ref 0–10)

## 2023-09-01 LAB — VITAMIN D 25 HYDROXY (VIT D DEFICIENCY, FRACTURES): Vit D, 25-Hydroxy: 23.4 ng/mL — ABNORMAL LOW (ref 30.0–100.0)

## 2023-09-27 ENCOUNTER — Other Ambulatory Visit: Payer: Self-pay | Admitting: Family Medicine

## 2023-09-27 DIAGNOSIS — G8929 Other chronic pain: Secondary | ICD-10-CM

## 2023-10-09 ENCOUNTER — Encounter: Payer: Self-pay | Admitting: Family Medicine

## 2023-10-09 ENCOUNTER — Telehealth: Admitting: Family Medicine

## 2023-10-09 DIAGNOSIS — M25512 Pain in left shoulder: Secondary | ICD-10-CM

## 2023-10-09 DIAGNOSIS — F419 Anxiety disorder, unspecified: Secondary | ICD-10-CM | POA: Diagnosis not present

## 2023-10-09 DIAGNOSIS — G8929 Other chronic pain: Secondary | ICD-10-CM

## 2023-10-09 DIAGNOSIS — R002 Palpitations: Secondary | ICD-10-CM | POA: Diagnosis not present

## 2023-10-09 DIAGNOSIS — E039 Hypothyroidism, unspecified: Secondary | ICD-10-CM | POA: Diagnosis not present

## 2023-10-09 DIAGNOSIS — F32A Depression, unspecified: Secondary | ICD-10-CM

## 2023-10-09 MED ORDER — ESCITALOPRAM OXALATE 10 MG PO TABS: 10.0000 mg | ORAL_TABLET | Freq: Every day | ORAL | 1 refills | Status: DC

## 2023-10-09 NOTE — Progress Notes (Signed)
 Virtual Visit via Video Note  I connected with Karen Carey on 10/09/23 at  1:00 PM EDT by a video enabled telemedicine application and verified that I am speaking with the correct person using two identifiers.  Patient Location: Other:  work - consents to visit while at work Provider Location: Office/Clinic  I discussed the limitations, risks, security, and privacy concerns of performing an evaluation and management service by video and the availability of in person appointments. I also discussed with the patient that there may be a patient responsible charge related to this service. The patient expressed understanding and agreed to proceed.  Subjective: PCP: Wellington Curtis DELENA, FNP  Chief Complaint  Patient presents with   Anxiety   Depression   HPI Discussed the use of AI scribe software for clinical note transcription with the patient, who gave verbal consent to proceed.  History of Present Illness Karen Carey is a 38 year old female who presents for a follow-up regarding mood and anxiety management.  She started taking Lexapro , which has significantly improved her mood and anxiety. Her heart no longer races to the point of insomnia, and she can fall asleep quickly. However, she occasionally wakes up in the middle of the night but can usually return to sleep. She still experiences heart palpitations and is awaiting a heart monitor for further evaluation.  She was diagnosed with shoulder tendinitis and was supposed to start physical therapy but had to cancel her first appointment due to her grandfather's illness, which required her to travel out of town. She plans to reschedule the appointment.  She recently visited the emergency department due to severe stomach pain and vomiting, which she attributes to taking meloxicam . The pain was described as a twisting sensation in the center of her abdomen, and she was unable to keep anything down, including water. She was treated  for GERD at the hospital and has since stopped taking meloxicam , with no further issues reported.  She is taking a vitamin D  supplement of 1000 IU daily due to previously low levels. Her Synthroid  dosage is at 88 mcg.  In terms of social history, she works as an Print production planner for a [employer] and is currently at work during the conversation. She mentions having a boyfriend who took her to the hospital during her recent episode of stomach pain.  ROS: Per HPI  Current Outpatient Medications:    [START ON 11/16/2023] escitalopram  (LEXAPRO ) 10 MG tablet, Take 1 tablet (10 mg total) by mouth daily., Disp: 90 tablet, Rfl: 1   levothyroxine  (SYNTHROID ) 88 MCG tablet, Take 1 tablet (88 mcg total) by mouth daily., Disp: 90 tablet, Rfl: 3   Melatonin Gummies 5 MG CHEW, Chew 15 mg by mouth at bedtime., Disp: , Rfl:   Observations/Objective: There were no vitals filed for this visit. Physical Exam Constitutional:      General: She is not in acute distress.    Appearance: Normal appearance. She is normal weight. She is not ill-appearing, toxic-appearing or diaphoretic.  Eyes:     Extraocular Movements: Extraocular movements intact.     Pupils: Pupils are equal, round, and reactive to light.  Pulmonary:     Effort: Pulmonary effort is normal.  Musculoskeletal:        General: Normal range of motion.     Cervical back: Normal range of motion.  Neurological:     General: No focal deficit present.     Mental Status: She is alert and oriented to  person, place, and time. Mental status is at baseline.     GCS: GCS eye subscore is 4. GCS verbal subscore is 5. GCS motor subscore is 6.     Cranial Nerves: No cranial nerve deficit.     Gait: Gait normal.  Psychiatric:        Attention and Perception: Attention and perception normal.        Mood and Affect: Mood and affect normal.        Speech: Speech normal.        Behavior: Behavior normal. Behavior is cooperative.        Thought Content:  Thought content normal.        Cognition and Memory: Cognition and memory normal.        Judgment: Judgment normal.     Assessment and Plan: Anxiety and depression -     Escitalopram  Oxalate; Take 1 tablet (10 mg total) by mouth daily.  Dispense: 90 tablet; Refill: 1  Acquired hypothyroidism  Palpitations  Chronic left shoulder pain  Assessment and Plan Assessment & Plan Anxiety & Depression, improved on escitalopram  Depression and anxiety has improved significantly with escitalopram  10 mg. She reports decreased heart racing and improved sleep onset, though she occasionally wakes up at night. Overall, she feels less stressed and happier, indicating a positive response to the medication. - Continue escitalopram  10 mg daily  Heart palpitations, ongoing evaluation Heart palpitations persist despite improvement in mood. The Zio patch for cardiac monitoring has not yet been received, necessitating further evaluation to rule out cardiac issues. - Follow up on the Zio patch order for cardiac monitoring  Chronic Shoulder Pain due to Shoulder tendinitis, pending physical therapy Diagnosed with shoulder tendinitis. Initial physical therapy appointment was missed due to a family emergency. No surgical intervention is required, and rehabilitation through physical therapy is planned. - Reschedule and attend physical therapy for shoulder tendinitis  Vitamin D  deficiency, on supplementation Vitamin D  deficiency is being managed with supplementation of 1000 IU daily. She reports adherence to the supplementation regimen. - Continue vitamin D  supplementation at 1000 IU daily  Gastroesophageal reflux due to NSAID use, resolved Gastroesophageal reflux symptoms occurred after taking meloxicam , leading to severe stomach pain and vomiting. Symptoms resolved after discontinuing meloxicam , likely due to gastric irritation from the NSAID. - Avoid use of meloxicam   Hypothyroidism, stable on  levothyroxine  Hypothyroidism is well-managed with levothyroxine  88 mcg. Current thyroid  function tests indicate appropriate management with no need for dosage adjustment. - Continue levothyroxine  88 mcg daily    Follow Up Instructions: Return in about 6 months (around 04/09/2024) for Mood Check.   I discussed the assessment and treatment plan with the patient. The patient was provided an opportunity to ask questions, and all were answered. The patient agreed with the plan and demonstrated an understanding of the instructions.   The patient was advised to call back or seek an in-person evaluation if the symptoms worsen or if the condition fails to improve as anticipated.  The above assessment and management plan was discussed with the patient. The patient verbalized understanding of and has agreed to the management plan.   Curtis DELENA Boom, FNP

## 2023-10-13 ENCOUNTER — Ambulatory Visit: Attending: Family Medicine

## 2023-10-13 ENCOUNTER — Other Ambulatory Visit: Payer: Self-pay | Admitting: *Deleted

## 2023-10-13 DIAGNOSIS — R002 Palpitations: Secondary | ICD-10-CM

## 2023-10-20 ENCOUNTER — Other Ambulatory Visit: Payer: Self-pay | Admitting: Family Medicine

## 2023-10-20 ENCOUNTER — Encounter: Payer: Self-pay | Admitting: Family Medicine

## 2023-10-20 DIAGNOSIS — K2100 Gastro-esophageal reflux disease with esophagitis, without bleeding: Secondary | ICD-10-CM

## 2023-10-20 MED ORDER — PANTOPRAZOLE SODIUM 40 MG PO TBEC: 40.0000 mg | DELAYED_RELEASE_TABLET | Freq: Every day | ORAL | 1 refills | Status: AC

## 2023-10-27 ENCOUNTER — Other Ambulatory Visit: Payer: Self-pay | Admitting: Family Medicine

## 2023-10-27 DIAGNOSIS — G8929 Other chronic pain: Secondary | ICD-10-CM

## 2024-02-26 DIAGNOSIS — I7774 Dissection of vertebral artery: Secondary | ICD-10-CM | POA: Insufficient documentation

## 2024-03-05 ENCOUNTER — Telehealth: Admitting: Family

## 2024-03-05 DIAGNOSIS — R399 Unspecified symptoms and signs involving the genitourinary system: Secondary | ICD-10-CM | POA: Diagnosis not present

## 2024-03-05 MED ORDER — CEPHALEXIN 500 MG PO CAPS: 500.0000 mg | ORAL_CAPSULE | Freq: Two times a day (BID) | ORAL | 0 refills | Status: AC

## 2024-03-05 NOTE — Progress Notes (Signed)
 " Virtual Visit Consent   Karen Carey, you are scheduled for a virtual visit with a [facility] provider today. Just as with appointments in the office, your consent must be obtained to participate. Your consent will be active for this visit and any virtual visit you may have with one of our providers in the next 365 days. If you have a MyChart account, a copy of this consent can be sent to you electronically.  As this is a virtual visit, video technology does not allow for your provider to perform a traditional examination. This may limit your provider's ability to fully assess your condition. If your provider identifies any concerns that need to be evaluated in person or the need to arrange testing (such as labs, EKG, etc.), we will make arrangements to do so. Although advances in technology are sophisticated, we cannot ensure that it will always work on either your end or our end. If the connection with a video visit is poor, the visit may have to be switched to a telephone visit. With either a video or telephone visit, we are not always able to ensure that we have a secure connection.  By engaging in this virtual visit, you consent to the provision of healthcare and authorize for your insurance to be billed (if applicable) for the services provided during this visit. Depending on your insurance coverage, you may receive a charge related to this service.  I need to obtain your verbal consent now. Are you willing to proceed with your visit today? Karen Carey has provided verbal consent on 03/05/2024 for a virtual visit (video or telephone). Bari Learn, FNP  Date: 03/05/2024 9:56 AM   Virtual Visit via Video Note   I, Bari Learn, connected with  Karen Carey  ([id number], [birth year]) on 03/05/24 at 10:00 AM EST by a video-enabled telemedicine application and verified that I am speaking with the correct person using two identifiers.  Location: Patient: Virtual Visit Location  Patient: Home Provider: Virtual Visit Location Provider: Home Office   I discussed the limitations of evaluation and management by telemedicine and the availability of in person appointments. The patient expressed understanding and agreed to proceed.    History of Present Illness: Karen Carey is a 39 y.o. who identifies as a female who was assigned female at birth, and is being seen today for UTI symptoms that started several days ago.  HPI: Dysuria  This is a new problem. The current episode started in the past 7 days. The problem has been gradually worsening. The quality of the pain is described as burning. The pain is at a severity of 4/10. The pain is mild. Associated symptoms include a discharge, frequency, nausea and urgency. Pertinent negatives include no hematuria, hesitancy or vomiting. Associated symptoms comments: Lower abdomen pain, cloudy urine. She has tried increased fluids for the symptoms. The treatment provided mild relief.    Problems:  Patient Active Problem List   Diagnosis Date Noted   Chronic constipation 01/29/2023   Gastroesophageal reflux disease 01/27/2022   Palpitations 01/27/2022   Hypothyroidism 09/05/2021   Anxiety and depression 07/24/2021    Allergies: Allergies[1] Medications: Current Medications[2]  Observations/Objective: Patient is well-developed, well-nourished in no acute distress.  Resting comfortably  at home.  Head is normocephalic, atraumatic.  No labored breathing.  Speech is clear and coherent with logical content.  Patient is alert and oriented at baseline.    Assessment and Plan: 1. UTI symptoms (Primary) - cephALEXin  (KEFLEX ) 500  MG capsule; Take 1 capsule (500 mg total) by mouth 2 (two) times daily.  Dispense: 14 capsule; Refill: 0  Force fluids Start Keflex  AZO over the counter X2 days  Follow up in person if symptoms worsen or do not improve   Follow Up Instructions: I discussed the assessment and treatment plan with  the patient. The patient was provided an opportunity to ask questions and all were answered. The patient agreed with the plan and demonstrated an understanding of the instructions.  A copy of instructions were sent to the patient via MyChart unless otherwise noted below.     The patient was advised to call back or seek an in-person evaluation if the symptoms worsen or if the condition fails to improve as anticipated.    Bari Learn, FNP    [1]  Allergies Allergen Reactions   Meloxicam  Nausea And Vomiting    GERD  [2]  Current Outpatient Medications:    cephALEXin  (KEFLEX ) 500 MG capsule, Take 1 capsule (500 mg total) by mouth 2 (two) times daily., Disp: 14 capsule, Rfl: 0   escitalopram  (LEXAPRO ) 10 MG tablet, Take 1 tablet (10 mg total) by mouth daily., Disp: 90 tablet, Rfl: 1   levothyroxine  (SYNTHROID ) 88 MCG tablet, Take 1 tablet (88 mcg total) by mouth daily., Disp: 90 tablet, Rfl: 3   Melatonin Gummies 5 MG CHEW, Chew 15 mg by mouth at bedtime., Disp: , Rfl:    pantoprazole  (PROTONIX ) 40 MG tablet, Take 1 tablet (40 mg total) by mouth daily., Disp: 90 tablet, Rfl: 1  "

## 2024-03-07 DIAGNOSIS — F17200 Nicotine dependence, unspecified, uncomplicated: Secondary | ICD-10-CM | POA: Insufficient documentation

## 2024-03-08 DIAGNOSIS — I67841 Reversible cerebrovascular vasoconstriction syndrome: Secondary | ICD-10-CM | POA: Insufficient documentation

## 2024-03-08 NOTE — Telephone Encounter (Signed)
 Resident follow up:   Please schedule patient for follow as follows:  Resident Clinic Template: return neurology Slot length: 50 minute slot (default for PGY-2 new and return) Provider: Dr. Myra Diagnosis: RCVS Time range:  between 2-3 months Preferred day of week: Any Please ensure scheduled PRIOR to appointment: n/a  If none of these dates are available, please reply to message sender for further instructions. If none of these dates are available, can use Stroke Fellow (Dr. Lenny). If these are not available please reply to message sender for further instructions.}  Please reply to message sender with an epic inbox message to confirm the appointment has been scheduled.

## 2024-03-08 NOTE — Consults (Signed)
 Case Management Brief Assessment   General: Care Manager / Social Worker assessed the patient by : Telephone conversation with patient, Medical record review, Discussion with Clinical Care team  Patient is a 39 y.o. female with a pertinent medical history of Right vertebral artery dissection (V2 level), hx of migraines, hypothyroidism, depression, GERD, and PID who was admitted to [facility] on 03/06/2024 for right sided weakness with right-sided head and neck pain and stroke work-up. Admit to Neurology. CM will continue to follow for progression of care and discharge planning needs.  Extended Emergency Contact Information Primary Emergency Contact: Overbey,Curtis Mobile Phone: [phone] Relation: Friend Interpreter needed? No Secondary Emergency Contact: Ward,Kenny Address: [address]          [place], [place] [address] [place] Home Phone: [phone] Mobile Phone: [phone] Relation: Father   Financial Information: Need for financial assistance?: No   Discharge Needs: Concerns to be Addressed: no discharge needs identified  Clinical risk factors: Other (Comment)   Discharge Plan: Screen findings are: Discharge planning needs identified or anticipated (Comment). Outpatient PT/OT/SLP ordered. No DME recs.   Estimated Discharge Date: 03/08/2024  Initial Assessment complete?: Yes  Additional Information:   Social Drivers of Health   Food Insecurity: No Food Insecurity (03/08/2024)   Hunger Vital Sign    Worried About Running Out of Food in the Last Year: Never true    Ran Out of Food in the Last Year: Never true  Tobacco Use: High Risk (03/07/2024)   Patient History    Smoking Tobacco Use: Every Day    Smokeless Tobacco Use: Never    Passive Exposure: Past  Transportation Needs: No Transportation Needs (03/08/2024)   PRAPARE - Transportation    Lack of Transportation (Medical): No    Lack of Transportation (Non-Medical): No  Alcohol Use: Not  on file  Housing: Low Risk (03/08/2024)   Housing    Within the past 12 months, have you ever stayed: outside, in a car, in a tent, in an overnight shelter, or temporarily in someone else's home (i.e. couch-surfing)?: No    Are you worried about losing your housing?: No  Physical Activity: Sufficiently Active (08/27/2023)   Received from [facility]   Exercise Vital Sign    On average, how many days per week do you engage in moderate to strenuous exercise (like a brisk walk)?: 4 days    On average, how many minutes do you engage in exercise at this level?: 60 min  Utilities: Low Risk (03/08/2024)   Utilities    Within the past 12 months, have you been unable to get utilities (heat, electricity) when it was really needed?: No  Stress: Stress Concern Present (08/27/2023)   Received from [facility] - Occupational Stress Questionnaire    Do you feel stress - tense, restless, nervous, or anxious, or unable to sleep at night because your mind is troubled all the time - these days?: Very much  Interpersonal Safety: Not At Risk (03/06/2024)   Interpersonal Safety    Unsafe Where You Currently Live: No    Physically Hurt by Anyone: No    Abused by Anyone: No  Substance Use: Not on file (12/20/2022)  Intimate Partner Violence: Not on file  Social Connections: Socially Isolated (08/27/2023)   Received from [facility]   Social Connection and Isolation Panel    In a typical week, how many times do you talk on the phone with family, friends,  or neighbors?: Twice a week    How often do you get together with friends or relatives?: Once a week    How often do you attend church or religious services?: Never    Do you belong to any clubs or organizations such as church groups, unions, fraternal or athletic groups, or school groups?: No    Attends Banker Meetings: Not on file    Are you married, widowed, divorced, separated, never  married, or living with a partner?: Divorced  Physicist, Medical Strain: Low Risk (03/08/2024)   Overall Financial Resource Strain (CARDIA)    Difficulty of Paying Living Expenses: Not hard at all  Health Literacy: Not on file  Internet Connectivity: Not on file    Predictive Model Details  No score data available for [facility] Risk of Unplanned Readmission

## 2024-03-10 NOTE — Telephone Encounter (Signed)
 Called back patient- she is having trouble with brain fog symptoms starting yesterday but noticing more today around 10 AM. She feels her speech and talking is normal. No speech issues. Trouble remembering to parting hair. Mild headache improved with tylenol. Pending verapamil. Eating an drinking okay. She feels it is more disorientation. Will monitor and if any concerning findings to go to ED.

## 2024-03-15 ENCOUNTER — Telehealth

## 2024-03-15 ENCOUNTER — Ambulatory Visit: Payer: Self-pay

## 2024-03-15 DIAGNOSIS — I67841 Reversible cerebrovascular vasoconstriction syndrome: Secondary | ICD-10-CM | POA: Diagnosis not present

## 2024-03-15 DIAGNOSIS — I7774 Dissection of vertebral artery: Secondary | ICD-10-CM | POA: Diagnosis not present

## 2024-03-15 DIAGNOSIS — F419 Anxiety disorder, unspecified: Secondary | ICD-10-CM | POA: Diagnosis not present

## 2024-03-15 DIAGNOSIS — M62838 Other muscle spasm: Secondary | ICD-10-CM | POA: Diagnosis not present

## 2024-03-15 DIAGNOSIS — E039 Hypothyroidism, unspecified: Secondary | ICD-10-CM

## 2024-03-15 DIAGNOSIS — F32A Depression, unspecified: Secondary | ICD-10-CM | POA: Diagnosis not present

## 2024-03-15 MED ORDER — BUPROPION HCL ER (XL) 150 MG PO TB24: 150.0000 mg | ORAL_TABLET | Freq: Every day | ORAL | 3 refills | Status: AC

## 2024-03-15 MED ORDER — TIZANIDINE HCL 4 MG PO TABS: 4.0000 mg | ORAL_TABLET | Freq: Four times a day (QID) | ORAL | 1 refills | Status: AC | PRN

## 2024-03-15 NOTE — Progress Notes (Signed)
 "   MyChart Video Visit    Virtual Visit via Video Note   This format is felt to be most appropriate for this patient at this time. Physical exam was limited by quality of the video and audio technology used for the visit.    Patient location: home Provider location: [facility] Persons involved in the visit: patient, provider  I discussed the limitations of evaluation and management by telemedicine and the availability of in person appointments. The patient expressed understanding and agreed to proceed.  Patient: Karen Carey   DOB: 1985/08/25   39 y.o. Female  MRN: [id number] Visit Date: 03/15/2024  Today's healthcare provider: Isaiah DELENA Pepper, MD   Chief complaint: neck pain  Subjective    HPI  Discussed the use of AI scribe software for clinical note transcription with the patient, who gave verbal consent to proceed.  History of Present Illness Karen Carey is a 39 year old female with hx of right vertebral artery dissection and RCVS who presents with severe neck pain and difficulty managing symptoms.  She initially experienced severe neck pain, which was misdiagnosed as a pinched nerve at an urgent care. Her symptoms worsened, leading to an inability to move her head and severe pain. She reports that the hospital told her she had an artery dissection. She was treated with morphine, Toradol , and oxycodone in the emergency department. She has difficulty sleeping, averaging about four hours per night. She has been without her muscle relaxer for a few days, which she was taking every four to six hours as needed for pain.  She has a history of reversible cerebral vasoconstriction syndrome (RCVS) and was taken off Lexapro  due to concerns it might trigger RCVS. Since stopping Lexapro , she has experienced increased irritability and emotional lability, including episodes of crying and irritability affecting interactions with her daughter. She has not tried other  medications for mental health prior to Lexapro .  She has a history of sleep difficulties, previously managed with melatonin, which she stopped due to lack of efficacy. Her sleep issues have worsened due to pain.  No current weakness on one side of her body, no numbness, word finding difficulty, no trouble eating, no trouble walking. She reports new onset stuttering attributed to exhaustion. No history of seizures.  She quit smoking a couple of years ago and recently stopped vaping cold turkey when her current health issues began.  Her pain is located at the back of her neck and radiates into her shoulder, described as a burning sensation. She has not tried gabapentin before   Review of systems as noted in HPI.      Objective    There were no vitals taken for this visit.      Physical Exam HENT:     Head: Normocephalic.  Eyes:     Pupils: Pupils are equal, round, and reactive to light.  Pulmonary:     Effort: Pulmonary effort is normal.  Neurological:     Mental Status: She is alert.     Comments: No facial droop noted. Occasional stutter noted.  Psychiatric:        Mood and Affect: Mood normal.        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   Relevant Medications   atorvastatin (LIPITOR) 80 MG tablet   verapamil (CALAN) 40 MG tablet  aspirin EC 81 MG tablet   Vertebral artery dissection   Relevant Medications   atorvastatin (LIPITOR) 80 MG tablet   verapamil (CALAN) 40 MG tablet   aspirin EC 81 MG tablet     Endocrine   Hypothyroidism     Other   Anxiety and depression   Relevant Medications   buPROPion  (WELLBUTRIN  XL) 150 MG 24 hr tablet   Other Visit Diagnoses       Muscle spasm       Relevant Medications   tiZANidine  (ZANAFLEX ) 4 MG tablet      Assessment & Plan Reversible cerebrovascular vasoconstriction  syndrome Vertebral artery dissection Patient was recently hospitalized for RCVS and vertebral artery dissection causing headache and right sided weakness, which has now improved. Patient continues to have neck pain as detailed below, but no focal neurologic signs. Started on aspirin, atorvastatin, and verapamil which patient is taking. Recently stopped lexapro  due to c/f worsening RCVS symptoms. Has close follow up with neurology. - Continue aspirin, statin, verapamil - Recommend follow up with neurology - Recommend ED if patient has focal neurologic symptoms  Muscle Spasm Severe neck pain radiating to the shoulder, described as burning, with associated spasm. Pain exacerbated by movement. Neurologist advised contacting primary care for muscle relaxer refill. Gabapentin considered as an alternative if muscle relaxers are ineffective. - Refilled muscle relaxer prescription. - Advised to contact neurologist if symptoms worsen despite muscle relaxer use. - Will consider gabapentin if muscle relaxers are ineffective.  Depression and anxiety Exacerbated by discontinuation of Lexapro  due to potential worsening of RCVS. Symptoms include irritability, crying, and mood swings. No prior use of Wellbutrin . Discussed potential benefits of Wellbutrin  for mood improvement and smoking cessation. No history of seizures, which is a consideration for Wellbutrin  use. Informed about limited data on Wellbutrin  use in RCVS and advised to discontinue if symptoms worsen. - Prescribed Wellbutrin  once daily - Instructed to monitor for worsening of RCVS symptoms and discontinue Wellbutrin  if symptoms worsen.  Hypothyroidism Elevated TSH noted in recent hospital evaluation, though thyroid  hormone levels were normal. Plan to re-evaluate thyroid  function at upcoming in-person appointment. - Will recheck thyroid  function at in-person appointment on February 27th.   Meds ordered this encounter  Medications   tiZANidine   (ZANAFLEX ) 4 MG tablet    Sig: Take 1 tablet (4 mg total) by mouth every 6 (six) hours as needed for muscle spasms.    Dispense:  30 tablet    Refill:  1   buPROPion  (WELLBUTRIN  XL) 150 MG 24 hr tablet    Sig: Take 1 tablet (150 mg total) by mouth daily.    Dispense:  30 tablet    Refill:  3     No follow-ups on file.     I discussed the assessment and treatment plan with the patient. The patient was provided an opportunity to ask questions and all were answered. The patient agreed with the plan and demonstrated an understanding of the instructions.   The patient was advised to call back or seek an in-person evaluation if the symptoms worsen or if the condition fails to improve as anticipated.   Isaiah DELENA Pepper, MD [facility] [phone] (phone) [phone] (fax) "

## 2024-04-08 ENCOUNTER — Ambulatory Visit
